# Patient Record
Sex: Male | Born: 1950 | Race: White | Hispanic: No | Marital: Married | Smoking: Former smoker
Health system: Southern US, Community
[De-identification: ages and names within clinical notes are randomized; demographics above are authoritative.]

## PROBLEM LIST (undated history)

## (undated) DIAGNOSIS — M549 Dorsalgia, unspecified: Secondary | ICD-10-CM

## (undated) DIAGNOSIS — G4730 Sleep apnea, unspecified: Secondary | ICD-10-CM

## (undated) DIAGNOSIS — C4490 Unspecified malignant neoplasm of skin, unspecified: Secondary | ICD-10-CM

## (undated) DIAGNOSIS — G8929 Other chronic pain: Secondary | ICD-10-CM

## (undated) DIAGNOSIS — E785 Hyperlipidemia, unspecified: Secondary | ICD-10-CM

## (undated) DIAGNOSIS — F419 Anxiety disorder, unspecified: Secondary | ICD-10-CM

## (undated) DIAGNOSIS — K219 Gastro-esophageal reflux disease without esophagitis: Secondary | ICD-10-CM

## (undated) DIAGNOSIS — G4733 Obstructive sleep apnea (adult) (pediatric): Secondary | ICD-10-CM

## (undated) HISTORY — DX: Other chronic pain: G89.29

## (undated) HISTORY — DX: Sleep apnea, unspecified: G47.30

## (undated) HISTORY — DX: Unspecified malignant neoplasm of skin, unspecified: C44.90

## (undated) HISTORY — DX: Gastro-esophageal reflux disease without esophagitis: K21.9

## (undated) HISTORY — DX: Anxiety disorder, unspecified: F41.9

## (undated) HISTORY — DX: Hyperlipidemia, unspecified: E78.5

## (undated) HISTORY — DX: Obstructive sleep apnea (adult) (pediatric): G47.33

## (undated) HISTORY — DX: Dorsalgia, unspecified: M54.9

## (undated) HISTORY — PX: COLONOSCOPY: SHX174

---

## 2003-12-09 ENCOUNTER — Ambulatory Visit: Payer: Self-pay | Admitting: Internal Medicine

## 2004-01-30 LAB — HM COLONOSCOPY: HM Colonoscopy: NORMAL

## 2006-01-29 HISTORY — PX: KNEE SURGERY: SHX244

## 2010-09-27 ENCOUNTER — Telehealth: Payer: Self-pay | Admitting: Internal Medicine

## 2010-09-27 ENCOUNTER — Other Ambulatory Visit: Payer: Self-pay | Admitting: Internal Medicine

## 2010-09-27 NOTE — Telephone Encounter (Signed)
I tried to call this patient tonight to get his dose on Methadone as we have not received his records.  I could not get him, as number was disconnected.  If he comes to clinic, we can provide a refill on Methadone, as he has been stable for several years on medication, however we must have him get records from [facility] and set up appt here.

## 2010-09-27 NOTE — Telephone Encounter (Signed)
Need rx refill methadone    Please call when ready pt would like to pick up 8/30

## 2010-09-28 ENCOUNTER — Other Ambulatory Visit: Payer: Self-pay | Admitting: Internal Medicine

## 2010-09-28 DIAGNOSIS — G8929 Other chronic pain: Secondary | ICD-10-CM

## 2010-09-28 MED ORDER — METHADONE HCL 10 MG PO TABS: 10.0000 mg | ORAL_TABLET | Freq: Three times a day (TID) | ORAL | Status: DC

## 2010-10-04 ENCOUNTER — Telehealth: Payer: Self-pay | Admitting: Internal Medicine

## 2010-10-04 NOTE — Telephone Encounter (Signed)
Work # [phone] Pt picked up rx last week for methadone the rx was for only 1 week pt will need another rx.  He would like to pick this up tomorrow if possible

## 2010-10-04 NOTE — Telephone Encounter (Signed)
That is fine. We can fill for one month. He should schedule appointment.

## 2010-10-05 ENCOUNTER — Telehealth: Payer: Self-pay | Admitting: Internal Medicine

## 2010-10-05 DIAGNOSIS — G8929 Other chronic pain: Secondary | ICD-10-CM

## 2010-10-05 MED ORDER — METHADONE HCL 10 MG PO TABS: 10.0000 mg | ORAL_TABLET | Freq: Three times a day (TID) | ORAL | Status: DC

## 2010-10-05 NOTE — Telephone Encounter (Signed)
Printed the Methadone Rx for you to sign.

## 2010-11-02 ENCOUNTER — Encounter: Payer: Self-pay | Admitting: Internal Medicine

## 2010-11-02 ENCOUNTER — Ambulatory Visit (INDEPENDENT_AMBULATORY_CARE_PROVIDER_SITE_OTHER): Payer: BC Managed Care – PPO | Admitting: Internal Medicine

## 2010-11-02 DIAGNOSIS — K219 Gastro-esophageal reflux disease without esophagitis: Secondary | ICD-10-CM | POA: Insufficient documentation

## 2010-11-02 DIAGNOSIS — F32A Depression, unspecified: Secondary | ICD-10-CM | POA: Insufficient documentation

## 2010-11-02 DIAGNOSIS — F329 Major depressive disorder, single episode, unspecified: Secondary | ICD-10-CM

## 2010-11-02 DIAGNOSIS — G8929 Other chronic pain: Secondary | ICD-10-CM | POA: Insufficient documentation

## 2010-11-02 MED ORDER — ALPRAZOLAM 0.5 MG PO TABS: 0.5000 mg | ORAL_TABLET | Freq: Three times a day (TID) | ORAL | Status: DC | PRN

## 2010-11-02 MED ORDER — METHADONE HCL 10 MG PO TABS: 10.0000 mg | ORAL_TABLET | Freq: Three times a day (TID) | ORAL | Status: DC

## 2010-11-02 MED ORDER — LANSOPRAZOLE 30 MG PO CPDR: 30.0000 mg | DELAYED_RELEASE_CAPSULE | Freq: Two times a day (BID) | ORAL | Status: DC

## 2010-11-02 MED ORDER — PAROXETINE HCL 40 MG PO TABS: 40.0000 mg | ORAL_TABLET | ORAL | Status: DC

## 2010-11-02 MED ORDER — VENLAFAXINE HCL ER 150 MG PO CP24: 150.0000 mg | ORAL_CAPSULE | Freq: Every day | ORAL | Status: DC

## 2010-11-02 MED ORDER — GABAPENTIN 300 MG PO CAPS: 600.0000 mg | ORAL_CAPSULE | Freq: Three times a day (TID) | ORAL | Status: DC

## 2010-11-02 NOTE — Progress Notes (Signed)
Subjective:    Patient ID: Bryan Hale, male    DOB: [birth year], 60 y.o.   MRN: [id number]  HPI Mr. Goffe is a 60 year old male with a history of chronic pain secondary to an injury he sustained from a fall. The pain is mostly located in his lower back. He has been followed at the pain clinic in the past. Over the last several years, he has been stable on current dose of methadone. He has not requested medication refills early. He has been fully compliant with medication instructions.  He reports a recent increase in his anxiety and depression. He notes that his job is currently unstable and his salary has decreased. He is debating changing jobs. He also has significant stress at home related to an issue with his neighbor. He does not wish to make changes in his medications today.  Outpatient Encounter Prescriptions as of 11/02/2010  Medication Sig Dispense Refill  . ALPRAZolam (XANAX) 0.5 MG tablet Take 1 tablet (0.5 mg total) by mouth 3 (three) times daily as needed.  90 tablet  3  . gabapentin (NEURONTIN) 300 MG capsule Take 2 capsules (600 mg total) by mouth 3 (three) times daily. Take one tablet 8 times a day  180 capsule  3  . lansoprazole (PREVACID) 30 MG capsule Take 1 capsule (30 mg total) by mouth 2 (two) times daily.  60 capsule  3  . methadone (DOLOPHINE) 10 MG tablet Take 1 tablet (10 mg total) by mouth 3 (three) times daily.  90 tablet  0  . PARoxetine (PAXIL) 40 MG tablet Take 1 tablet (40 mg total) by mouth every morning.  30 tablet  11  . venlafaxine (EFFEXOR-XR) 150 MG 24 hr capsule Take 1 capsule (150 mg total) by mouth daily.  30 capsule  11    Review of Systems  Constitutional: Negative for fever, chills, activity change, appetite change, fatigue and unexpected weight change.  Eyes: Negative for visual disturbance.  Respiratory: Negative for cough and shortness of breath.   Cardiovascular: Negative for chest pain, palpitations and leg swelling.  Gastrointestinal:  Negative for abdominal pain and abdominal distention.  Genitourinary: Negative for dysuria, urgency and difficulty urinating.  Musculoskeletal: Positive for myalgias, back pain and arthralgias. Negative for gait problem.  Skin: Negative for color change and rash.  Hematological: Negative for adenopathy.  Psychiatric/Behavioral: Positive for dysphoric mood. Negative for sleep disturbance. The patient is nervous/anxious.    BP 166/91  Pulse 68  Temp(Src) 98.8 F (37.1 C) (Oral)  Resp 16  Ht 5' 9.5" (1.765 m)  Wt 193 lb 4 oz (87.658 kg)  BMI 28.13 kg/m2  SpO2 94%     Objective:   Physical Exam  Constitutional: He is oriented to person, place, and time. He appears well-developed and well-nourished. No distress.  HENT:  Head: Normocephalic and atraumatic.  Right Ear: External ear normal.  Left Ear: External ear normal.  Nose: Nose normal.  Mouth/Throat: Oropharynx is clear and moist. No oropharyngeal exudate.  Eyes: Conjunctivae and EOM are normal. Pupils are equal, round, and reactive to light. Right eye exhibits no discharge. Left eye exhibits no discharge. No scleral icterus.  Neck: Normal range of motion. Neck supple. No tracheal deviation present. No thyromegaly present.  Cardiovascular: Normal rate, regular rhythm and normal heart sounds.  Exam reveals no gallop and no friction rub.   No murmur heard. Pulmonary/Chest: Effort normal and breath sounds normal. No respiratory distress. He has no wheezes. He has no rales. He exhibits  no tenderness.  Musculoskeletal: Normal range of motion. He exhibits no edema.  Lymphadenopathy:    He has no cervical adenopathy.  Neurological: He is alert and oriented to person, place, and time. No cranial nerve deficit. Coordination normal.  Skin: Skin is warm and dry. No rash noted. He is not diaphoretic. No erythema. No pallor.  Psychiatric: His behavior is normal. Judgment and thought content normal. His mood appears anxious. Cognition and memory  are normal. He exhibits a depressed mood.          Assessment & Plan:  1. Chronic back pain - After injury sustained from fall. Followed at pain clinic in past, and continues to see them q6-12 months.  Will get records from previous office for review. Has been stable on current dose of methodone for years. Will plan to continue. Will need to be seen at minimum q41months.   2. Anxiety/Depression - Recently exacerbated with decrease in salary and work stress.  We discussed change in meds, but he would prefer to leave for now.  RTC in 3 months.

## 2010-12-07 ENCOUNTER — Encounter: Payer: Self-pay | Admitting: Internal Medicine

## 2010-12-27 ENCOUNTER — Other Ambulatory Visit: Payer: Self-pay | Admitting: *Deleted

## 2010-12-27 DIAGNOSIS — K219 Gastro-esophageal reflux disease without esophagitis: Secondary | ICD-10-CM

## 2010-12-27 MED ORDER — LANSOPRAZOLE 30 MG PO CPDR: 30.0000 mg | DELAYED_RELEASE_CAPSULE | Freq: Two times a day (BID) | ORAL | Status: DC

## 2010-12-28 ENCOUNTER — Other Ambulatory Visit: Payer: Self-pay | Admitting: Internal Medicine

## 2010-12-28 DIAGNOSIS — G8929 Other chronic pain: Secondary | ICD-10-CM

## 2010-12-28 MED ORDER — METHADONE HCL 10 MG PO TABS: 10.0000 mg | ORAL_TABLET | Freq: Three times a day (TID) | ORAL | Status: DC

## 2010-12-28 NOTE — Telephone Encounter (Signed)
Patient notified that rx is ready. Rx left up front for pick up.

## 2011-01-24 ENCOUNTER — Telehealth: Payer: Self-pay | Admitting: Internal Medicine

## 2011-01-24 DIAGNOSIS — G8929 Other chronic pain: Secondary | ICD-10-CM

## 2011-01-24 NOTE — Telephone Encounter (Signed)
ok 

## 2011-01-24 NOTE — Telephone Encounter (Signed)
OK for RF of Methadone?

## 2011-01-25 MED ORDER — METHADONE HCL 10 MG PO TABS: 10.0000 mg | ORAL_TABLET | Freq: Three times a day (TID) | ORAL | Status: DC

## 2011-01-25 NOTE — Telephone Encounter (Signed)
Pending signature

## 2011-01-25 NOTE — Telephone Encounter (Signed)
Patient informed. 

## 2011-02-02 ENCOUNTER — Ambulatory Visit: Payer: BC Managed Care – PPO | Admitting: Internal Medicine

## 2011-02-03 ENCOUNTER — Encounter: Payer: Self-pay | Admitting: Internal Medicine

## 2011-02-07 ENCOUNTER — Ambulatory Visit: Payer: BC Managed Care – PPO | Admitting: Internal Medicine

## 2011-02-26 ENCOUNTER — Telehealth: Payer: Self-pay | Admitting: Internal Medicine

## 2011-02-26 DIAGNOSIS — G8929 Other chronic pain: Secondary | ICD-10-CM

## 2011-02-26 NOTE — Telephone Encounter (Signed)
Fine to fill. May print 3 months worth. Thanks

## 2011-02-26 NOTE — Telephone Encounter (Signed)
[phone] Pt called to get methadone rx please call when ready

## 2011-02-27 MED ORDER — METHADONE HCL 10 MG PO TABS: 10.0000 mg | ORAL_TABLET | Freq: Three times a day (TID) | ORAL | Status: DC

## 2011-02-27 NOTE — Telephone Encounter (Signed)
Pending signature

## 2011-02-28 NOTE — Telephone Encounter (Signed)
Pt picked up rx's

## 2011-04-05 ENCOUNTER — Telehealth: Payer: Self-pay | Admitting: Internal Medicine

## 2011-04-05 NOTE — Telephone Encounter (Signed)
I wrote note and printed.

## 2011-04-05 NOTE — Telephone Encounter (Signed)
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

## 2011-04-06 NOTE — Telephone Encounter (Signed)
Pt picked up note today.

## 2011-04-12 ENCOUNTER — Other Ambulatory Visit: Payer: Self-pay | Admitting: *Deleted

## 2011-04-12 DIAGNOSIS — K219 Gastro-esophageal reflux disease without esophagitis: Secondary | ICD-10-CM

## 2011-04-12 MED ORDER — LANSOPRAZOLE 30 MG PO CPDR: 30.0000 mg | DELAYED_RELEASE_CAPSULE | Freq: Two times a day (BID) | ORAL | Status: DC

## 2011-05-07 ENCOUNTER — Ambulatory Visit (INDEPENDENT_AMBULATORY_CARE_PROVIDER_SITE_OTHER): Payer: BC Managed Care – PPO | Admitting: Internal Medicine

## 2011-05-07 ENCOUNTER — Encounter: Payer: Self-pay | Admitting: Internal Medicine

## 2011-05-07 VITALS — BP 171/97 | HR 74 | Temp 98.9°F | Ht 69.5 in | Wt 201.2 lb

## 2011-05-07 DIAGNOSIS — H6691 Otitis media, unspecified, right ear: Secondary | ICD-10-CM | POA: Insufficient documentation

## 2011-05-07 DIAGNOSIS — H6690 Otitis media, unspecified, unspecified ear: Secondary | ICD-10-CM

## 2011-05-07 MED ORDER — BENZONATATE 200 MG PO CAPS: 200.0000 mg | ORAL_CAPSULE | Freq: Three times a day (TID) | ORAL | Status: AC | PRN

## 2011-05-07 MED ORDER — DOXYCYCLINE HYCLATE 100 MG PO TABS: 100.0000 mg | ORAL_TABLET | Freq: Two times a day (BID) | ORAL | Status: AC

## 2011-05-07 NOTE — Progress Notes (Signed)
Subjective:    Patient ID: Bryan Hale, male    DOB: 01/12/1951, 61 y.o.   MRN: [id number]  HPI 61 year old male presents for an acute visit complaining of five-day history of sore throat, fever, chills, nasal congestion, cough productive of purulent sputum. He reports that sore throat is gradually improving. He has been using some over-the-counter cough and cold medications with minimal improvement. He reports significant fatigue. He denies any shortness of breath. He notes some right ear pain. He notes numerous sick contacts at work. He also notes that he was bit by a tick over the weekend. He has had some headache over the last 24 hours.  Outpatient Prescriptions Prior to Visit  Medication Sig Dispense Refill  . ALPRAZolam (XANAX) 0.5 MG tablet Take 1 tablet (0.5 mg total) by mouth 3 (three) times daily as needed.  90 tablet  3  . lansoprazole (PREVACID) 30 MG capsule Take 1 capsule (30 mg total) by mouth 2 (two) times daily.  180 capsule  1  . methadone (DOLOPHINE) 10 MG tablet Take 1 tablet (10 mg total) by mouth 3 (three) times daily. Fill on or after 04/27/11  90 tablet  0  . PARoxetine (PAXIL) 40 MG tablet Take 1 tablet (40 mg total) by mouth every morning.  30 tablet  11  . venlafaxine (EFFEXOR-XR) 150 MG 24 hr capsule Take 1 capsule (150 mg total) by mouth daily.  30 capsule  11  . gabapentin (NEURONTIN) 300 MG capsule Take 2 capsules (600 mg total) by mouth 3 (three) times daily. Take one tablet 8 times a day  180 capsule  3    Review of Systems  Constitutional: Positive for fever, chills and fatigue. Negative for activity change.  HENT: Positive for congestion, sore throat, rhinorrhea and postnasal drip. Negative for hearing loss, ear pain, nosebleeds, sneezing, trouble swallowing, neck pain, neck stiffness, voice change, sinus pressure, tinnitus and ear discharge.   Eyes: Negative for discharge, redness, itching and visual disturbance.  Respiratory: Positive for cough. Negative  for chest tightness, shortness of breath, wheezing and stridor.   Cardiovascular: Negative for chest pain and leg swelling.  Gastrointestinal: Positive for nausea. Negative for abdominal pain, diarrhea and constipation.  Musculoskeletal: Negative for myalgias and arthralgias.  Skin: Negative for color change and rash.  Neurological: Positive for headaches. Negative for dizziness and facial asymmetry.  Psychiatric/Behavioral: Negative for sleep disturbance.   BP 171/97  Pulse 74  Temp(Src) 98.9 F (37.2 C) (Oral)  Ht 5' 9.5" (1.765 m)  Wt 201 lb 4 oz (91.286 kg)  BMI 29.29 kg/m2  SpO2 95%     Objective:   Physical Exam  Constitutional: He is oriented to person, place, and time. He appears well-developed and well-nourished. No distress.  HENT:  Head: Normocephalic and atraumatic.  Right Ear: External ear normal. Tympanic membrane is erythematous and bulging. A middle ear effusion is present.  Left Ear: External ear normal. Tympanic membrane is not retracted and not bulging.  No middle ear effusion.  Nose: Nose normal.  Mouth/Throat: Oropharynx is clear and moist. No oropharyngeal exudate.  Eyes: Conjunctivae and EOM are normal. Pupils are equal, round, and reactive to light. Right eye exhibits no discharge. Left eye exhibits no discharge. No scleral icterus.  Neck: Normal range of motion. Neck supple. No tracheal deviation present. No thyromegaly present.  Cardiovascular: Normal rate, regular rhythm and normal heart sounds.  Exam reveals no gallop and no friction rub.   No murmur heard. Pulmonary/Chest: Effort normal.  No accessory muscle usage. Not tachypneic. No respiratory distress. He has no wheezes. He has rhonchi in the left lower field. He has no rales. He exhibits no tenderness.  Musculoskeletal: Normal range of motion. He exhibits no edema.  Lymphadenopathy:    He has no cervical adenopathy.  Neurological: He is alert and oriented to person, place, and time. No cranial nerve  deficit. Coordination normal.  Skin: Skin is warm and dry. No rash noted. He is not diaphoretic. No erythema. No pallor.  Psychiatric: He has a normal mood and affect. His behavior is normal. Judgment and thought content normal.          Assessment & Plan:

## 2011-05-07 NOTE — Assessment & Plan Note (Signed)
Symptoms are most consistent with viral infection which has now progressed to secondary bacterial otitis media. Given that he has a recent history of a tick bite and headache will treat ear infection with doxycycline 100 mg twice daily for 14 days, to provide coverage for RMSF. He will call clinic or followup if symptoms are not improving over the next 48 hours. He will use Tessalon as needed for cough.

## 2011-05-15 ENCOUNTER — Telehealth: Payer: Self-pay | Admitting: *Deleted

## 2011-05-15 DIAGNOSIS — F329 Major depressive disorder, single episode, unspecified: Secondary | ICD-10-CM

## 2011-05-15 MED ORDER — ALPRAZOLAM 0.5 MG PO TABS: 0.5000 mg | ORAL_TABLET | Freq: Three times a day (TID) | ORAL | Status: DC | PRN

## 2011-05-15 NOTE — Telephone Encounter (Signed)
Called in.

## 2011-05-15 NOTE — Telephone Encounter (Signed)
Fine to fill. #90 with 3 refills

## 2011-05-15 NOTE — Telephone Encounter (Signed)
[facility] faxed RF request -  Xanax 0.5 mg 1 tid prn. OK? If yes, how many RFs?

## 2011-05-18 ENCOUNTER — Telehealth: Payer: Self-pay | Admitting: Internal Medicine

## 2011-05-18 MED ORDER — LEVOFLOXACIN 750 MG PO TABS: 750.0000 mg | ORAL_TABLET | Freq: Every day | ORAL | Status: AC

## 2011-05-18 NOTE — Telephone Encounter (Signed)
[facility] Triage Call Report Triage Record Num: [id number] Operator: Lyn Hollingshead Patient Name: Bryan Hale Call Date & Time: 05/18/2011 3:13:55PM Patient Phone: [phone] PCP: Ronna Polio Patient Gender: Male PCP Fax : [phone] Patient DOB: 04/13/1950 Practice Name: [facility] Reason for Call: Caller: [name]/Patient; PCP: Ronna Polio; CB#: [phone]; ; ; Call regarding Cough/Congestion; PLEASE CALL PT- Onset-05/09/11 Afebrile. Pt has congestion and productive cough with yellow sputum. States he was seen by Dr. Dan Humphreys and told to call back if s/s no better and he could get another antibiotic called in. Emergent s/s of URI protocol r/o. Pt to see provider within 24 hrs. Message sent because pt states he was told to call back and rx could be sent if s/s not improved. Pharmacy is [facility]. Protocol(s) Used: Upper Respiratory Infection (URI) Recommended Outcome per Protocol: See Provider within 24 hours Reason for Outcome: Productive cough with colored sputum (other than clear or white sputum) Care Advice: ~ Use a cool mist humidifier to moisten air. Be sure to clean according to manufacturer's instructions. ~ May inhale steam from hot shower or heated water. Be careful to avoid burns. ~ Warm fluids may help, or try a mixture of honey and lemon juice in warm tea. ~ SYMPTOM / CONDITION MANAGEMENT Coughing up mucus or phlegm helps to get rid of an infection. A productive cough should not be stopped. A cough medicine with guaifenesin (Robitussin, Mucinex) can help loosen the mucus. Cough medicine with dextromethorphan (DM) should be avoided. Drinking lots of fluids can help loosen the mucus too, especially warm fluids. ~ 05/18/2011 3:35:05PM Page 1 of 1 [email or web address]

## 2011-05-18 NOTE — Telephone Encounter (Signed)
Please call in Levaquin 750mg  po daily x 7 days. He should be seen next week if no improvement.

## 2011-05-18 NOTE — Telephone Encounter (Signed)
Patient notified. Rx called to pharmacy,.

## 2011-05-24 ENCOUNTER — Other Ambulatory Visit: Payer: Self-pay | Admitting: *Deleted

## 2011-05-24 DIAGNOSIS — G8929 Other chronic pain: Secondary | ICD-10-CM

## 2011-05-24 MED ORDER — METHADONE HCL 10 MG PO TABS: 10.0000 mg | ORAL_TABLET | Freq: Three times a day (TID) | ORAL | Status: DC

## 2011-05-24 NOTE — Telephone Encounter (Signed)
Patient request refill on Methadone Last filled 02/27/11, #90 x 0 Last seen for chronic pain on 11/02/10, acute visit on 05/07/11 Follow up needed in 05/2011 Please advise refills.

## 2011-05-24 NOTE — Telephone Encounter (Signed)
Pt notified RX ready.

## 2011-07-03 ENCOUNTER — Other Ambulatory Visit: Payer: Self-pay | Admitting: *Deleted

## 2011-07-03 DIAGNOSIS — G8929 Other chronic pain: Secondary | ICD-10-CM

## 2011-07-03 MED ORDER — METHADONE HCL 10 MG PO TABS: 10.0000 mg | ORAL_TABLET | Freq: Three times a day (TID) | ORAL | Status: DC

## 2011-07-03 NOTE — Telephone Encounter (Signed)
Left message on cell phone voicemail advising patient that Rx is ready for pick up will be left at front desk. 

## 2011-08-03 ENCOUNTER — Other Ambulatory Visit: Payer: Self-pay | Admitting: Internal Medicine

## 2011-08-03 ENCOUNTER — Other Ambulatory Visit: Payer: Self-pay | Admitting: *Deleted

## 2011-08-03 DIAGNOSIS — G8929 Other chronic pain: Secondary | ICD-10-CM

## 2011-08-03 MED ORDER — METHADONE HCL 10 MG PO TABS: 10.0000 mg | ORAL_TABLET | Freq: Three times a day (TID) | ORAL | Status: DC

## 2011-08-03 NOTE — Telephone Encounter (Signed)
Rx rewritten so that Dr. Darrick Huntsman can sign it in Dr. Tilman Neat absence.  Rx signed by Dr. Darrick Huntsman and given to patient.

## 2011-08-03 NOTE — Telephone Encounter (Signed)
Refill on his Methadone 10 mg. Please call when it is finished.

## 2011-08-06 NOTE — Telephone Encounter (Signed)
Rx was rewritten so Dr. Darrick Huntsman can sign it since Dr. Dan Humphreys was out of the office, patient picked up Rx on 08/03/2011.

## 2011-08-31 DIAGNOSIS — F329 Major depressive disorder, single episode, unspecified: Secondary | ICD-10-CM | POA: Insufficient documentation

## 2011-10-02 ENCOUNTER — Other Ambulatory Visit: Payer: Self-pay | Admitting: *Deleted

## 2011-10-02 DIAGNOSIS — G8929 Other chronic pain: Secondary | ICD-10-CM

## 2011-10-02 MED ORDER — METHADONE HCL 10 MG PO TABS: 10.0000 mg | ORAL_TABLET | Freq: Three times a day (TID) | ORAL | Status: DC

## 2011-10-03 NOTE — Telephone Encounter (Signed)
Left message on cell phone voicemail advising patient that Rx is ready for pick up will be left at front desk. 

## 2011-11-01 ENCOUNTER — Other Ambulatory Visit: Payer: Self-pay | Admitting: Internal Medicine

## 2011-11-01 DIAGNOSIS — G8929 Other chronic pain: Secondary | ICD-10-CM

## 2011-11-01 MED ORDER — METHADONE HCL 10 MG PO TABS: 10.0000 mg | ORAL_TABLET | Freq: Three times a day (TID) | ORAL | Status: DC

## 2011-11-01 NOTE — Telephone Encounter (Signed)
Pt is needing Methodone refilled. Please call patient when you can.

## 2011-11-01 NOTE — Telephone Encounter (Signed)
Rx ready for pick up will be left at front desk, called patient on cell number and recording stated that code or number dialed is incorrect.  Called patient on number given and recording stated that number was not available for incoming calls.

## 2011-11-02 ENCOUNTER — Telehealth: Payer: Self-pay | Admitting: Internal Medicine

## 2011-11-02 NOTE — Telephone Encounter (Signed)
Disreguard the last message I seen where the rx is ready for pt to pick it up and I let him know.

## 2011-11-02 NOTE — Telephone Encounter (Signed)
Pt is needing his Metodone refilled he will be completely out by this weekend.

## 2011-11-05 ENCOUNTER — Other Ambulatory Visit: Payer: Self-pay | Admitting: Internal Medicine

## 2011-11-26 ENCOUNTER — Other Ambulatory Visit: Payer: Self-pay | Admitting: Internal Medicine

## 2011-11-27 ENCOUNTER — Other Ambulatory Visit: Payer: Self-pay | Admitting: Internal Medicine

## 2011-11-27 NOTE — Telephone Encounter (Signed)
Rx called to [facility], patient notified.

## 2011-11-27 NOTE — Telephone Encounter (Signed)
Pt calling to check on his xanax refill  [facility]  [phone] has already contacted [place] and needs prior autho

## 2011-12-05 ENCOUNTER — Ambulatory Visit (INDEPENDENT_AMBULATORY_CARE_PROVIDER_SITE_OTHER): Payer: BC Managed Care – PPO | Admitting: Internal Medicine

## 2011-12-05 ENCOUNTER — Encounter: Payer: Self-pay | Admitting: Internal Medicine

## 2011-12-05 VITALS — BP 120/80 | HR 67 | Temp 98.7°F | Ht 69.5 in

## 2011-12-05 DIAGNOSIS — F411 Generalized anxiety disorder: Secondary | ICD-10-CM

## 2011-12-05 DIAGNOSIS — G8929 Other chronic pain: Secondary | ICD-10-CM

## 2011-12-05 DIAGNOSIS — F419 Anxiety disorder, unspecified: Secondary | ICD-10-CM | POA: Insufficient documentation

## 2011-12-05 MED ORDER — METHADONE HCL 10 MG PO TABS: 10.0000 mg | ORAL_TABLET | Freq: Three times a day (TID) | ORAL | Status: DC

## 2011-12-05 MED ORDER — CLONAZEPAM 0.5 MG PO TABS: 0.5000 mg | ORAL_TABLET | Freq: Two times a day (BID) | ORAL | Status: DC | PRN

## 2011-12-05 MED ORDER — CLONAZEPAM 0.5 MG PO TABS: 0.5000 mg | ORAL_TABLET | Freq: Three times a day (TID) | ORAL | Status: DC | PRN

## 2011-12-05 NOTE — Progress Notes (Signed)
Subjective:    Patient ID: Bryan Hale, male    DOB: [birth year], 61 y.o.   MRN: [id number]  HPI 61 year old male with history of chronic pain secondary to traumatic injury, anxiety, and depression presents for acute visit complaining of recent worsening of anxiety with episodes of panic. He was previously taking Paxil and Effexor but felt that these medications are not effective. He tapered off Effexor. He noted no change in symptoms with stopping Effexor. He now continues on Paxil 40 mg daily and alprazolam as needed, typically once or twice a week for episodes of panic attack. He reports that overall stress at home and in his job have improved. However, he is having more frequent episodes of panic with overwhelming sense of dread and fever. He is unable to function during these episodes. He has some improvement with use of alprazolam but not complete relief of symptoms.  In regards to chronic pain, he reports symptoms have been well controlled on methadone. He has been stable on his dose of methadone for several years. He is also followed at the [facility] pain management clinic every 6 months. He has not requested any early refills. He has been compliant with pain contract.  Outpatient Encounter Prescriptions as of 12/05/2011  Medication Sig Dispense Refill  . ALPRAZolam (XANAX) 0.5 MG tablet TAKE ONE TABLET THREE TIMES A DAY       IF NEEDED  90 tablet  3  . gabapentin (NEURONTIN) 300 MG capsule Take 600 mg by mouth 3 (three) times daily.      . lansoprazole (PREVACID) 30 MG capsule Take 1 capsule (30 mg total) by mouth 2 (two) times daily.  180 capsule  1  . methadone (DOLOPHINE) 10 MG tablet Take 1 tablet (10 mg total) by mouth 3 (three) times daily. Fill on or after 02/04/2012  90 tablet  0  . clonazePAM (KLONOPIN) 0.5 MG tablet Take 1 tablet (0.5 mg total) by mouth 3 (three) times daily as needed for anxiety.  90 tablet  1  . PARoxetine (PAXIL) 40 MG tablet TAKE ONE (1) TABLET EACH DAY   30 tablet  6   BP 120/80  Pulse 67  Temp 98.7 F (37.1 C) (Oral)  Ht 5' 9.5" (1.765 m)  SpO2 97%  Review of Systems  Constitutional: Negative for fever, chills, activity change, appetite change, fatigue and unexpected weight change.  Eyes: Negative for visual disturbance.  Respiratory: Negative for cough and shortness of breath.   Cardiovascular: Negative for chest pain, palpitations and leg swelling.  Gastrointestinal: Negative for abdominal pain and abdominal distention.  Genitourinary: Negative for dysuria, urgency and difficulty urinating.  Musculoskeletal: Positive for myalgias, back pain and arthralgias. Negative for gait problem.  Skin: Negative for color change and rash.  Hematological: Negative for adenopathy.  Psychiatric/Behavioral: Positive for dysphoric mood. Negative for sleep disturbance. The patient is nervous/anxious.        Objective:   Physical Exam  Constitutional: He is oriented to person, place, and time. He appears well-developed and well-nourished. No distress.  HENT:  Head: Normocephalic.  Eyes: Pupils are equal, round, and reactive to light.  Neck: Normal range of motion.  Cardiovascular: Normal rate.   Pulmonary/Chest: Effort normal.  Neurological: He is alert and oriented to person, place, and time. He has normal reflexes.  Skin: He is not diaphoretic.  Psychiatric: His speech is normal and behavior is normal. Judgment and thought content normal. His mood appears anxious. Cognition and memory are normal.  Assessment & Plan:

## 2011-12-05 NOTE — Assessment & Plan Note (Signed)
Patient reports recent increase in anxiety. No improvement on Paxil. Will try adding Clonopin to help with symptoms of panic. We discussed side effects of this medication including sedation. We also discussed potentially adding a beta blocker to help with symptoms of panic or fear during social interaction. Patient will e-mail with update next week. He will followup in one month.

## 2011-12-05 NOTE — Assessment & Plan Note (Signed)
Patient with chronic pain in his back and ankle secondary to previous injury. He is followed at the pain clinic and has been on chronic methadone. He will continue to follow both here and with regular followup at the pain clinic. Refill given on Methadone today. Followup here 1 month.

## 2012-02-27 ENCOUNTER — Other Ambulatory Visit: Payer: Self-pay | Admitting: Internal Medicine

## 2012-04-07 ENCOUNTER — Other Ambulatory Visit: Payer: Self-pay | Admitting: Internal Medicine

## 2012-04-07 DIAGNOSIS — G8929 Other chronic pain: Secondary | ICD-10-CM

## 2012-04-07 MED ORDER — METHADONE HCL 10 MG PO TABS: 10.0000 mg | ORAL_TABLET | Freq: Three times a day (TID) | ORAL | Status: DC

## 2012-04-07 NOTE — Telephone Encounter (Signed)
PT CALLED TO GET HIS METADONE REFILLED PT WOULD LIKE TO PICK UP 3/11 AFTER 3:30

## 2012-04-07 NOTE — Telephone Encounter (Signed)
Tried to call patient to let him know Rx was ready for pick up. Neither number was in service

## 2012-04-07 NOTE — Telephone Encounter (Signed)
Patient requesting refill on Methadone to be picked up today.

## 2012-04-07 NOTE — Telephone Encounter (Signed)
Rx printed signed and left up front for pick up

## 2012-05-07 ENCOUNTER — Other Ambulatory Visit: Payer: Self-pay | Admitting: General Practice

## 2012-05-07 DIAGNOSIS — G8929 Other chronic pain: Secondary | ICD-10-CM

## 2012-05-07 MED ORDER — METHADONE HCL 10 MG PO TABS: 10.0000 mg | ORAL_TABLET | Freq: Three times a day (TID) | ORAL | Status: DC

## 2012-05-07 NOTE — Telephone Encounter (Signed)
Dr. Dan Humphreys pt requesting a methadone refill. Pt last seen on 12/05/11. Med last filled on 3/10 # 90 with 0 refills. Pt takes medication 3 times daily. Please advise.

## 2012-05-07 NOTE — Telephone Encounter (Signed)
Will fill for one month.

## 2012-05-07 NOTE — Telephone Encounter (Signed)
Pt called back.  Advised pt would be filled x 1 month.  Pt stated he would be able to pick up tomorrow or Friday.  Please advise if this will not be ready by then.

## 2012-05-07 NOTE — Telephone Encounter (Signed)
Cannot reach pt. Rx placed up front for pt to pick up.

## 2012-05-08 NOTE — Telephone Encounter (Signed)
Pt called back and was notified rx was available at the front desk.

## 2012-05-24 ENCOUNTER — Other Ambulatory Visit: Payer: Self-pay | Admitting: Internal Medicine

## 2012-05-26 NOTE — Telephone Encounter (Signed)
Rx sent to pharmacy by escript  

## 2012-06-04 ENCOUNTER — Telehealth: Payer: Self-pay | Admitting: Emergency Medicine

## 2012-06-04 NOTE — Telephone Encounter (Signed)
Pt needs to be seen every 3 months on chronic pain medications. Needs a follow up visit.

## 2012-06-04 NOTE — Telephone Encounter (Signed)
Left message with pt's wife to return my call.  

## 2012-06-04 NOTE — Telephone Encounter (Signed)
Pt called stating he needs a refill on his methadone. Please advise

## 2012-06-05 ENCOUNTER — Ambulatory Visit (INDEPENDENT_AMBULATORY_CARE_PROVIDER_SITE_OTHER): Payer: BC Managed Care – PPO | Admitting: Internal Medicine

## 2012-06-05 ENCOUNTER — Encounter: Payer: Self-pay | Admitting: Internal Medicine

## 2012-06-05 VITALS — BP 168/80 | HR 72 | Temp 98.4°F | Wt 201.0 lb

## 2012-06-05 DIAGNOSIS — F411 Generalized anxiety disorder: Secondary | ICD-10-CM

## 2012-06-05 DIAGNOSIS — F419 Anxiety disorder, unspecified: Secondary | ICD-10-CM

## 2012-06-05 DIAGNOSIS — G4733 Obstructive sleep apnea (adult) (pediatric): Secondary | ICD-10-CM

## 2012-06-05 DIAGNOSIS — G8929 Other chronic pain: Secondary | ICD-10-CM

## 2012-06-05 MED ORDER — METHADONE HCL 10 MG PO TABS: 10.0000 mg | ORAL_TABLET | Freq: Three times a day (TID) | ORAL | Status: DC

## 2012-06-05 MED ORDER — PAROXETINE HCL 40 MG PO TABS: 40.0000 mg | ORAL_TABLET | ORAL | Status: DC

## 2012-06-05 MED ORDER — GABAPENTIN 300 MG PO CAPS: 600.0000 mg | ORAL_CAPSULE | Freq: Three times a day (TID) | ORAL | Status: DC

## 2012-06-05 MED ORDER — CLONAZEPAM 0.5 MG PO TABS: 0.5000 mg | ORAL_TABLET | Freq: Three times a day (TID) | ORAL | Status: DC | PRN

## 2012-06-05 MED ORDER — ALPRAZOLAM 0.5 MG PO TABS: ORAL_TABLET | ORAL | Status: DC

## 2012-06-05 NOTE — Assessment & Plan Note (Signed)
Symptoms well controlled with Paxil and Klonopin. Pt also occasionally uses alprazolam for severe episodes of panic. Will continue current medications. Continue follow up with psychologist. Follow up here in 6 months and prn.

## 2012-06-05 NOTE — Assessment & Plan Note (Signed)
Recently diagnosed with obstructive sleep apnea. Will request records on sleep study. Recommended to proceed with BiPAP as directed.

## 2012-06-05 NOTE — Progress Notes (Signed)
Subjective:    Patient ID: Bryan Hale, male    DOB: May 02, 1950, 62 y.o.   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 He reports in the interim since his last visit, he was seen by pain management physician at [facility]. No changes were made in his medications. He was having increased frequency of anxiety and panic attacks and was seen by psychologist. He had briefly stopped taking Paxil but was placed back on this medication with some improvement. He also continues to use Clonopin and alprazolam as needed. His psychologist was concerned about sleep apnea playing a role in his symptoms and he underwent sleep study which she reports showed severe obstructive sleep apnea. He underwent titration study showing need for BiPAP. He is scheduled to start on BiPAP therapy today.   Outpatient Encounter Prescriptions as of 06/05/2012  Medication Sig Dispense Refill  . ALPRAZolam (XANAX) 0.5 MG tablet TAKE ONE TABLET THREE TIMES A DAY       IF NEEDED  90 tablet  3  . clonazePAM (KLONOPIN) 0.5 MG tablet Take 1 tablet (0.5 mg total) by mouth 3 (three) times daily as needed for anxiety.  90 tablet  1  . gabapentin (NEURONTIN) 300 MG capsule Take 2 capsules (600 mg total) by mouth 3 (three) times daily.  180 capsule  11  . lansoprazole (PREVACID) 30 MG capsule TAKE ONE CAPSULE TWICE A DAY  180 capsule  1  . methadone (DOLOPHINE) 10 MG tablet Take 1 tablet (10 mg total) by mouth 3 (three) times daily. Fill 08/05/2012  90 tablet  0  . PARoxetine (PAXIL) 40 MG tablet Take 1 tablet (40 mg total) by mouth every morning.  90 tablet  4   No facility-administered encounter medications on file as of 06/05/2012.   BP 168/80  Pulse 72  Temp(Src) 98.4 F (36.9 C) (Oral)  Wt 201 lb (91.173 kg)  BMI 29.27 kg/m2  SpO2 97%  Review of Systems  Constitutional: Negative for fever, chills, activity change, appetite change,  fatigue and unexpected weight change.  Eyes: Negative for visual disturbance.  Respiratory: Negative for cough and shortness of breath.   Cardiovascular: Negative for chest pain, palpitations and leg swelling.  Gastrointestinal: Negative for abdominal pain and abdominal distention.  Genitourinary: Negative for dysuria, urgency and difficulty urinating.  Musculoskeletal: Positive for myalgias, back pain and arthralgias. Negative for gait problem.  Skin: Negative for color change and rash.  Hematological: Negative for adenopathy.  Psychiatric/Behavioral: Negative for suicidal ideas, sleep disturbance and dysphoric mood. The patient is nervous/anxious.        Objective:   Physical Exam  Constitutional: He is oriented to person, place, and time. He appears well-developed and well-nourished. No distress.  HENT:  Head: Normocephalic and atraumatic.  Right Ear: External ear normal.  Left Ear: External ear normal.  Nose: Nose normal.  Mouth/Throat: Oropharynx is clear and moist. No oropharyngeal exudate.  Eyes: Conjunctivae and EOM are normal. Pupils are equal, round, and reactive to light. Right eye exhibits no discharge. Left eye exhibits no discharge. No scleral icterus.  Neck: Normal range of motion. Neck supple. No tracheal deviation present. No thyromegaly present.  Cardiovascular: Normal rate, regular rhythm and normal heart sounds.  Exam reveals no gallop and no friction rub.   No murmur heard. Pulmonary/Chest: Effort normal and breath sounds normal. No respiratory distress. He has no wheezes. He has no rales. He exhibits no  tenderness.  Musculoskeletal: Normal range of motion. He exhibits no edema.  Lymphadenopathy:    He has no cervical adenopathy.  Neurological: He is alert and oriented to person, place, and time. No cranial nerve deficit. Coordination normal.  Skin: Skin is warm and dry. No rash noted. He is not diaphoretic. No erythema. No pallor.  Psychiatric: He has a normal  mood and affect. His behavior is normal. Judgment and thought content normal.          Assessment & Plan:

## 2012-06-05 NOTE — Telephone Encounter (Signed)
Patient seen in clinic today

## 2012-06-05 NOTE — Assessment & Plan Note (Signed)
Symptoms stable and well controlled with methadone. Reviewed recent notes from pain management physician at [facility] including urine drug screen which was appropriately positive for methadone alone. Patient will followup at [facility] in 3 months and followup here in 6 months.Rx given for Methadone, dated for next 3 months.

## 2012-06-13 ENCOUNTER — Ambulatory Visit: Payer: BC Managed Care – PPO | Admitting: Internal Medicine

## 2012-06-24 ENCOUNTER — Other Ambulatory Visit: Payer: Self-pay | Admitting: *Deleted

## 2012-06-24 MED ORDER — LANSOPRAZOLE 30 MG PO CPDR: DELAYED_RELEASE_CAPSULE | ORAL | Status: DC

## 2012-06-24 NOTE — Telephone Encounter (Signed)
Eprescribed.

## 2012-06-25 ENCOUNTER — Telehealth: Payer: Self-pay | Admitting: *Deleted

## 2012-06-25 NOTE — Telephone Encounter (Signed)
Called [phone] for prior authorization on the Lansoprazole DR  30 mg, PA form is being faxed over

## 2012-06-25 NOTE — Telephone Encounter (Signed)
Received PA Criteria question form, put them in Dr. Sonny Dandy folder

## 2012-07-03 ENCOUNTER — Encounter: Payer: Self-pay | Admitting: Internal Medicine

## 2012-07-09 ENCOUNTER — Encounter: Payer: Self-pay | Admitting: Internal Medicine

## 2012-07-09 ENCOUNTER — Ambulatory Visit (INDEPENDENT_AMBULATORY_CARE_PROVIDER_SITE_OTHER): Payer: BC Managed Care – PPO | Admitting: Internal Medicine

## 2012-07-09 VITALS — BP 150/100 | HR 78 | Temp 98.6°F | Wt 197.0 lb

## 2012-07-09 DIAGNOSIS — M109 Gout, unspecified: Secondary | ICD-10-CM | POA: Insufficient documentation

## 2012-07-09 MED ORDER — INDOMETHACIN 50 MG PO CAPS: 50.0000 mg | ORAL_CAPSULE | Freq: Three times a day (TID) | ORAL | Status: DC

## 2012-07-09 MED ORDER — COLCHICINE 0.6 MG PO TABS: ORAL_TABLET | ORAL | Status: DC

## 2012-07-09 NOTE — Patient Instructions (Signed)
Gout  Gout is an inflammatory condition (arthritis) caused by a buildup of uric acid crystals in the joints. Uric acid is a chemical that is normally present in the blood. Under some circumstances, uric acid can form into crystals in your joints. This causes joint redness, soreness, and swelling (inflammation). Repeat attacks are common. Over time, uric acid crystals can form into masses (tophi) near a joint, causing disfigurement. Gout is treatable and often preventable.  CAUSES   The disease begins with elevated levels of uric acid in the blood. Uric acid is produced by your body when it breaks down a naturally found substance called purines. This also happens when you eat certain foods such as meats and fish. Causes of an elevated uric acid level include:   Being passed down from parent to child (heredity).   Diseases that cause increased uric acid production (obesity, psoriasis, some cancers).   Excessive alcohol use.   Diet, especially diets rich in meat and seafood.   Medicines, including certain cancer-fighting drugs (chemotherapy), diuretics, and aspirin.   Chronic kidney disease. The kidneys are no longer able to remove uric acid well.   Problems with metabolism.  Conditions strongly associated with gout include:   Obesity.   High blood pressure.   High cholesterol.   Diabetes.  Not everyone with elevated uric acid levels gets gout. It is not understood why some people get gout and others do not. Surgery, joint injury, and eating too much of certain foods are some of the factors that can lead to gout.  SYMPTOMS    An attack of gout comes on quickly. It causes intense pain with redness, swelling, and warmth in a joint.   Fever can occur.   Often, only one joint is involved. Certain joints are more commonly involved:   Base of the big toe.   Knee.   Ankle.   Wrist.   Finger.  Without treatment, an attack usually goes away in a few days to weeks. Between attacks, you usually will not have  symptoms, which is different from many other forms of arthritis.  DIAGNOSIS   Your caregiver will suspect gout based on your symptoms and exam. Removal of fluid from the joint (arthrocentesis) is done to check for uric acid crystals. Your caregiver will give you a medicine that numbs the area (local anesthetic) and use a needle to remove joint fluid for exam. Gout is confirmed when uric acid crystals are seen in joint fluid, using a special microscope. Sometimes, blood, urine, and X-ray tests are also used.  TREATMENT   There are 2 phases to gout treatment: treating the sudden onset (acute) attack and preventing attacks (prophylaxis).  Treatment of an Acute Attack   Medicines are used. These include anti-inflammatory medicines or steroid medicines.   An injection of steroid medicine into the affected joint is sometimes necessary.   The painful joint is rested. Movement can worsen the arthritis.   You may use warm or cold treatments on painful joints, depending which works best for you.   Discuss the use of coffee, vitamin C, or cherries with your caregiver. These may be helpful treatment options.  Treatment to Prevent Attacks  After the acute attack subsides, your caregiver may advise prophylactic medicine. These medicines either help your kidneys eliminate uric acid from your body or decrease your uric acid production. You may need to stay on these medicines for a very long time.  The early phase of treatment with prophylactic medicine can be associated   with an increase in acute gout attacks. For this reason, during the first few months of treatment, your caregiver may also advise you to take medicines usually used for acute gout treatment. Be sure you understand your caregiver's directions.  You should also discuss dietary treatment with your caregiver. Certain foods such as meats and fish can increase uric acid levels. Other foods such as dairy can decrease levels. Your caregiver can give you a list of foods  to avoid.  HOME CARE INSTRUCTIONS    Do not take aspirin to relieve pain. This raises uric acid levels.   Only take over-the-counter or prescription medicines for pain, discomfort, or fever as directed by your caregiver.   Rest the joint as much as possible. When in bed, keep sheets and blankets off painful areas.   Keep the affected joint raised (elevated).   Use crutches if the painful joint is in your leg.   Drink enough water and fluids to keep your urine clear or pale yellow. This helps your body get rid of uric acid. Do not drink alcoholic beverages. They slow the passage of uric acid.   Follow your caregiver's dietary instructions. Pay careful attention to the amount of protein you eat. Your daily diet should emphasize fruits, vegetables, whole grains, and fat-free or low-fat milk products.   Maintain a healthy body weight.  SEEK MEDICAL CARE IF:    You have an oral temperature above 102 F (38.9 C).   You develop diarrhea, vomiting, or any side effects from medicines.   You do not feel better in 24 hours, or you are getting worse.  SEEK IMMEDIATE MEDICAL CARE IF:    Your joint becomes suddenly more tender and you have:   Chills.   An oral temperature above 102 F (38.9 C), not controlled by medicine.  MAKE SURE YOU:    Understand these instructions.   Will watch your condition.   Will get help right away if you are not doing well or get worse.  Document Released: 01/13/2000 Document Revised: 04/09/2011 Document Reviewed: 04/25/2009  Exit[employer] Patient Information 2014 [employer].

## 2012-07-09 NOTE — Assessment & Plan Note (Signed)
Symptoms and exam are consistent with gout flare. Will treat with indomethacin and colchicine. We discussed potentially starting preventative medication in the future given he has had several episodes of gout over the last few months. We also discussed preventative measures including avoidance of alcohol and some foods such as venison. Followup 4 weeks.

## 2012-07-09 NOTE — Progress Notes (Signed)
Subjective:    Patient ID: Bryan Hale, male    DOB: 03/11/1950, 62 y.o.   MRN: [id number]  HPI 62 year old male with history of chronic pain presents for acute visit complaining of left knee pain and swelling gradually worsening over the last week. He reports symptoms are consistent with previous episodes of gout. In the past, he has used indomethacin with some improvement. The last week, he has been taking some ibuprofen at home with no improvement. He describes severe pain in his left knee that prevents him from sleeping. He also notes some swelling in his left knee and decreased range of motion. He has had several episodes of gout this year. He has never taken preventative medications for gout. He denies any fever or chills.  Outpatient Encounter Prescriptions as of 07/09/2012  Medication Sig Dispense Refill  . ALPRAZolam (XANAX) 0.5 MG tablet TAKE ONE TABLET THREE TIMES A DAY       IF NEEDED  90 tablet  3  . clonazePAM (KLONOPIN) 0.5 MG tablet Take 1 tablet (0.5 mg total) by mouth 3 (three) times daily as needed for anxiety.  90 tablet  1  . gabapentin (NEURONTIN) 300 MG capsule Take 2 capsules (600 mg total) by mouth 3 (three) times daily.  180 capsule  11  . lansoprazole (PREVACID) 30 MG capsule TAKE ONE CAPSULE TWICE A DAY  180 capsule  1  . methadone (DOLOPHINE) 10 MG tablet Take 1 tablet (10 mg total) by mouth 3 (three) times daily. Fill 08/05/2012  90 tablet  0  . PARoxetine (PAXIL) 40 MG tablet Take 1 tablet (40 mg total) by mouth every morning.  90 tablet  4  . QUEtiapine (SEROQUEL) 50 MG tablet Take 50 mg by mouth at bedtime.       No facility-administered encounter medications on file as of 07/09/2012.    Review of Systems  Constitutional: Negative for fever, chills, activity change, appetite change, fatigue and unexpected weight change.  Eyes: Negative for visual disturbance.  Respiratory: Negative for cough and shortness of breath.   Cardiovascular: Negative for chest pain,  palpitations and leg swelling.  Gastrointestinal: Negative for abdominal pain and abdominal distention.  Genitourinary: Negative for dysuria, urgency and difficulty urinating.  Musculoskeletal: Positive for myalgias and arthralgias. Negative for gait problem.  Skin: Negative for color change and rash.  Hematological: Negative for adenopathy.  Psychiatric/Behavioral: Negative for sleep disturbance and dysphoric mood. The patient is not nervous/anxious.        Objective:   Physical Exam  Constitutional: He is oriented to person, place, and time. He appears well-developed and well-nourished. No distress.  HENT:  Head: Normocephalic and atraumatic.  Right Ear: External ear normal.  Left Ear: External ear normal.  Nose: Nose normal.  Mouth/Throat: Oropharynx is clear and moist.  Eyes: Conjunctivae and EOM are normal. Pupils are equal, round, and reactive to light. Right eye exhibits no discharge. Left eye exhibits no discharge. No scleral icterus.  Neck: Normal range of motion. Neck supple. No tracheal deviation present. No thyromegaly present.  Cardiovascular: Normal rate.  Exam reveals no friction rub.   Pulmonary/Chest: Effort normal.  Musculoskeletal: He exhibits no edema.       Left knee: He exhibits decreased range of motion, swelling and erythema.  Lymphadenopathy:    He has no cervical adenopathy.  Neurological: He is alert and oriented to person, place, and time. No cranial nerve deficit. Coordination normal.  Skin: Skin is warm and dry. No rash noted. He  is not diaphoretic. No erythema. No pallor.  Psychiatric: He has a normal mood and affect. His behavior is normal. Judgment and thought content normal.          Assessment & Plan:

## 2012-09-03 ENCOUNTER — Telehealth: Payer: Self-pay | Admitting: Internal Medicine

## 2012-09-03 DIAGNOSIS — G8929 Other chronic pain: Secondary | ICD-10-CM

## 2012-09-03 NOTE — Telephone Encounter (Signed)
Methadone refill needed

## 2012-09-03 NOTE — Telephone Encounter (Signed)
Fine to fill. Needs to come in for UDS and to sign contract

## 2012-09-03 NOTE — Telephone Encounter (Signed)
Ok to refill?, Received rx in June with notation do not fill until 7/8

## 2012-09-04 MED ORDER — METHADONE HCL 10 MG PO TABS: 10.0000 mg | ORAL_TABLET | Freq: Three times a day (TID) | ORAL | Status: DC

## 2012-09-04 NOTE — Telephone Encounter (Signed)
Left message informing patient we will refill prescription but he has to come pick it up with ID.

## 2012-09-16 ENCOUNTER — Encounter: Payer: Self-pay | Admitting: Internal Medicine

## 2012-10-16 ENCOUNTER — Telehealth: Payer: Self-pay | Admitting: Internal Medicine

## 2012-10-16 DIAGNOSIS — G8929 Other chronic pain: Secondary | ICD-10-CM

## 2012-10-16 NOTE — Telephone Encounter (Signed)
Pt came in today wanting dr walker to write him a refill rx for methadyone Pt has 1 week worth left  Pt stated his pain dr wrote rx for 10mg  but it was oct not sept Pt stated dr walker knows he is trying to get off this.  He wanted to know if she could write rx for 5mg   Please advise

## 2012-10-16 NOTE — Telephone Encounter (Signed)
Fwd to Dr. Walker 

## 2012-10-16 NOTE — Telephone Encounter (Signed)
We can refill his medication, however I would encourage him to talk to his pain management physician prior to trying to taper Methadone.

## 2012-10-17 MED ORDER — METHADONE HCL 10 MG PO TABS: 10.0000 mg | ORAL_TABLET | Freq: Three times a day (TID) | ORAL | Status: DC

## 2012-10-17 NOTE — Telephone Encounter (Signed)
Patient informed and has already spoken with pain doctor about this.

## 2012-12-08 ENCOUNTER — Encounter: Payer: BC Managed Care – PPO | Admitting: Internal Medicine

## 2012-12-10 ENCOUNTER — Encounter: Payer: BC Managed Care – PPO | Admitting: Internal Medicine

## 2013-03-03 ENCOUNTER — Ambulatory Visit (INDEPENDENT_AMBULATORY_CARE_PROVIDER_SITE_OTHER): Payer: BC Managed Care – PPO | Admitting: Family Medicine

## 2013-03-03 ENCOUNTER — Encounter: Payer: Self-pay | Admitting: Family Medicine

## 2013-03-03 ENCOUNTER — Encounter: Payer: Self-pay | Admitting: *Deleted

## 2013-03-03 VITALS — BP 138/82 | HR 72 | Temp 98.2°F | Wt 203.5 lb

## 2013-03-03 DIAGNOSIS — J019 Acute sinusitis, unspecified: Secondary | ICD-10-CM

## 2013-03-03 DIAGNOSIS — B9689 Other specified bacterial agents as the cause of diseases classified elsewhere: Secondary | ICD-10-CM

## 2013-03-03 MED ORDER — AMOXICILLIN-POT CLAVULANATE 875-125 MG PO TABS: 1.0000 | ORAL_TABLET | Freq: Two times a day (BID) | ORAL | Status: AC

## 2013-03-03 NOTE — Progress Notes (Signed)
Pre-visit discussion using our clinic review tool. No additional management support is needed unless otherwise documented below in the visit note.  

## 2013-03-03 NOTE — Patient Instructions (Signed)
You have a sinus infection. Take medicine as prescribed: augmentin twice daily for 10 days Push fluids and plenty of rest. Nasal saline irrigation or neti pot to help drain sinuses. May use plain mucinex or immediate release guaifenesin with plenty of fluid to help mobilize mucous. May use ibuprofen for sinus inflammation with meals as needed. Let us know if fever >101.5, trouble opening/closing mouth, difficulty swallowing, or worsening - you may need to be seen again.

## 2013-03-03 NOTE — Progress Notes (Signed)
   Subjective:    Patient ID: Bryan Hale, male    DOB: Dec 16, 1950, 63 y.o.   MRN: [id number]  HPI CC: "I feel like death"  9d h/o cold sxs.  Started with ST, that progressed to significant head congestion and pressure headache.  Yesterday with worsening head congestion, dizziness, and ringing in ears, also with chest congestion/tightness, feverish.  Cough productive of clear mucous.  Feeling weak from feeling ill.  Some nausea and decreased appetite. Fatigued.  In bed all weekend.  No rhinorrhea or PNdrainage, ear or tooth pain, abd pain, rashes. So far has tried OTC remedies which haven't helped. Ex smoker - quit 2012. Wife sick recently - with sinus infection. No h/o asthma Did not receive flu shot this year.  Past Medical History  Diagnosis Date  . Chronic back pain   . Skin cancer   . Anxiety   . Gout   . Vitamin D deficiency   . Hyperlipidemia   . Chronic pain   . Esophageal reflux   . Sleep apnea, obstructive     starting BiPAP     Review of Systems Per HPI    Objective:   Physical Exam  Nursing note and vitals reviewed. Constitutional: He appears well-developed and well-nourished. No distress.  HENT:  Head: Normocephalic and atraumatic.  Right Ear: Hearing, tympanic membrane, external ear and ear canal normal.  Left Ear: Hearing, tympanic membrane, external ear and ear canal normal.  Nose: Mucosal edema present. No rhinorrhea. Right sinus exhibits no maxillary sinus tenderness and no frontal sinus tenderness. Left sinus exhibits no maxillary sinus tenderness and no frontal sinus tenderness.  Mouth/Throat: Uvula is midline and mucous membranes are normal. Posterior oropharyngeal erythema present. No oropharyngeal exudate, posterior oropharyngeal edema or tonsillar abscesses.  Congested TM on right R>L nasal mucosal irritation  Eyes: Conjunctivae and EOM are normal. Pupils are equal, round, and reactive to light. No scleral icterus.  Neck: Normal range of  motion. Neck supple.  Cardiovascular: Normal rate, regular rhythm, normal heart sounds and intact distal pulses.   No murmur heard. Pulmonary/Chest: Effort normal and breath sounds normal. No respiratory distress. He has no wheezes. He has no rales.  Lymphadenopathy:    He has no cervical adenopathy.  Skin: Skin is warm and dry. No rash noted.       Assessment & Plan:

## 2013-03-03 NOTE — Assessment & Plan Note (Signed)
Given duration and progression of sxs anticipate has developed bacterial sinus infection Treat as such with augmentin 10d course Update if sxs persist or deteriorate. Pt agrees with plan.

## 2013-03-04 ENCOUNTER — Telehealth: Payer: Self-pay | Admitting: Internal Medicine

## 2013-03-04 NOTE — Telephone Encounter (Signed)
Diane (spouse) left message on 03/02/13.  I returned her call left message for her to call office

## 2013-07-02 ENCOUNTER — Ambulatory Visit: Payer: BC Managed Care – PPO | Admitting: Internal Medicine

## 2013-07-06 ENCOUNTER — Other Ambulatory Visit: Payer: Self-pay | Admitting: Internal Medicine

## 2013-09-14 ENCOUNTER — Telehealth: Payer: Self-pay | Admitting: Internal Medicine

## 2013-09-14 NOTE — Telephone Encounter (Signed)
The patient is needing a sooner appointment for his depression and anxiety. Please advise.

## 2013-09-14 NOTE — Telephone Encounter (Signed)
See below note.

## 2013-09-14 NOTE — Telephone Encounter (Signed)
Can we add him at 11am on August 27th? 43min

## 2013-09-14 NOTE — Telephone Encounter (Signed)
Patient has been scheduled

## 2013-09-24 ENCOUNTER — Ambulatory Visit (INDEPENDENT_AMBULATORY_CARE_PROVIDER_SITE_OTHER): Payer: BC Managed Care – PPO | Admitting: Internal Medicine

## 2013-09-24 ENCOUNTER — Telehealth: Payer: Self-pay | Admitting: Internal Medicine

## 2013-09-24 ENCOUNTER — Encounter: Payer: Self-pay | Admitting: Internal Medicine

## 2013-09-24 VITALS — BP 162/80 | HR 72 | Ht 69.5 in | Wt 197.5 lb

## 2013-09-24 DIAGNOSIS — G8929 Other chronic pain: Secondary | ICD-10-CM

## 2013-09-24 DIAGNOSIS — F411 Generalized anxiety disorder: Secondary | ICD-10-CM | POA: Insufficient documentation

## 2013-09-24 MED ORDER — GABAPENTIN 100 MG PO CAPS
100.0000 mg | ORAL_CAPSULE | Freq: Three times a day (TID) | ORAL | Status: DC
Start: 2013-09-24 — End: 2013-10-07

## 2013-09-24 MED ORDER — LORAZEPAM 1 MG PO TABS: 1.0000 mg | ORAL_TABLET | Freq: Three times a day (TID) | ORAL | Status: DC | PRN

## 2013-09-24 MED ORDER — LANSOPRAZOLE 30 MG PO CPDR: 30.0000 mg | DELAYED_RELEASE_CAPSULE | Freq: Two times a day (BID) | ORAL | Status: DC

## 2013-09-24 MED ORDER — SERTRALINE HCL 100 MG PO TABS: 150.0000 mg | ORAL_TABLET | Freq: Every day | ORAL | Status: DC

## 2013-09-24 NOTE — Progress Notes (Signed)
Pre visit review using our clinic review tool, if applicable. No additional management support is needed unless otherwise documented below in the visit note. 

## 2013-09-24 NOTE — Assessment & Plan Note (Signed)
Generalized anxiety, severe. Will increase Sertraline to 150mg  daily. Change Clonazepam to Lorazepam 1mg  po tid prn. Discussed potential risks of sedation with this medication. Will place psych referral. Follow up here in 2 weeks or sooner as needed.  Over 40min of which >50% spent in face-to-face contact with patient discussing plan of care

## 2013-09-24 NOTE — Progress Notes (Signed)
Subjective:    Patient ID: Bryan Hale, male    DOB: 06-Jan-1951, 63 y.o.   MRN: [id number]  HPI 63YO male presents for follow up. Difficult time for him. 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 Had been on Methadone for 8 years. During taper, he "felt horrible" with sweats, fatigue, increase pain and anxiety. Those symptoms have improved except for anxiety. He feels anxious every day about "everything." He finds it hard to go to work. 6 weeks ago his father died and this seemed to make symptoms even worse. His wife also left on a vacation and this has increased anxiety. He went to [facility] and they started him on Sertraline and Clonazepam. He feels that this medication has not been helpful. He has trouble sleeping and focusing at work. He has occasional symptoms of depression, but predominately anxiety.  Review of Systems  Constitutional: Positive for fatigue. Negative for fever, chills, activity change, appetite change and unexpected weight change.  Eyes: Negative for visual disturbance.  Respiratory: Negative for cough and shortness of breath.   Cardiovascular: Negative for chest pain, palpitations and leg swelling.  Gastrointestinal: Negative for abdominal pain and abdominal distention.  Genitourinary: Negative for dysuria, urgency and difficulty urinating.  Musculoskeletal: Positive for arthralgias and myalgias. Negative for gait problem.  Skin: Negative for color change and rash.  Hematological: Negative for adenopathy.  Psychiatric/Behavioral: Positive for sleep disturbance, dysphoric mood, decreased concentration and agitation. Negative for suicidal ideas, hallucinations and self-injury. The patient is nervous/anxious. The patient is not hyperactive.        Objective:    BP 162/80  Pulse 72  Ht 5' 9.5" (1.765 m)  Wt 197 lb 8 oz (89.585 kg)  BMI 28.76 kg/m2  SpO2 97% Physical Exam    Constitutional: He is oriented to person, place, and time. He appears well-developed and well-nourished. No distress.  HENT:  Head: Normocephalic and atraumatic.  Right Ear: External ear normal.  Left Ear: External ear normal.  Nose: Nose normal.  Mouth/Throat: Oropharynx is clear and moist.  Eyes: Conjunctivae and EOM are normal. Pupils are equal, round, and reactive to light. Right eye exhibits no discharge. Left eye exhibits no discharge. No scleral icterus.  Neck: Normal range of motion. Neck supple. No tracheal deviation present. No thyromegaly present.  Cardiovascular: Normal rate, regular rhythm and normal heart sounds.  Exam reveals no gallop and no friction rub.   No murmur heard. Pulmonary/Chest: Effort normal and breath sounds normal. No accessory muscle usage. Not tachypneic. No respiratory distress. He has no decreased breath sounds. He has no wheezes. He has no rhonchi. He has no rales. He exhibits no tenderness.  Musculoskeletal: Normal range of motion. He exhibits no edema.  Lymphadenopathy:    He has no cervical adenopathy.  Neurological: He is alert and oriented to person, place, and time. No cranial nerve deficit. Coordination normal.  Skin: Skin is warm and dry. No rash noted. He is not diaphoretic. No erythema. No pallor.  Psychiatric: His speech is normal and behavior is normal. Judgment and thought content normal. His mood appears anxious. He expresses no suicidal ideation.          Assessment & Plan:   Problem List Items Addressed This Visit     Unprioritized   Chronic pain     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 Currently using only prn Motrin for pain. Will start Neurontin  100mg  po tid and taper up for better pain control. He tolerated this medication weill in the past. Follow up 2 weeks and prn.    Relevant Medications      gabapentin (NEURONTIN) capsule      sertraline (ZOLOFT) tablet   Generalized anxiety disorder -  Primary     Generalized anxiety, severe. Will increase Sertraline to 150mg  daily. Change Clonazepam to Lorazepam 1mg  po tid prn. Discussed potential risks of sedation with this medication. Will place psych referral. Follow up here in 2 weeks or sooner as needed.  Over 84min of which >50% spent in face-to-face contact with patient discussing plan of care     Relevant Medications      LORazepam (ATIVAN) tablet      sertraline (ZOLOFT) tablet   Other Relevant Orders      Ambulatory referral to Psychiatry       Return in about 2 weeks (around 10/08/2013) for Recheck.

## 2013-09-24 NOTE — Patient Instructions (Signed)
Start taking Zoloft (Sertraline) 150mg  at bedtime.  Use Ativan 1mg  as needed up to three times daily for anxiety.  Start Neurontin (Gabapentin) 100mg  three times daily. Email with update Monday and we will titrate up dose.  Follow up in 2 weeks.

## 2013-09-24 NOTE — Telephone Encounter (Signed)
1pm on Wed Sept 9th 22min

## 2013-09-24 NOTE — Assessment & Plan Note (Signed)
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 Currently using only prn Motrin for pain. Will start Neurontin 100mg  po tid and taper up for better pain control. He tolerated this medication weill in the past. Follow up 2 weeks and prn.

## 2013-09-24 NOTE — Telephone Encounter (Signed)
Pt needs 2 week follow up (30 mins) please advise where to add pt.

## 2013-09-28 ENCOUNTER — Encounter: Payer: Self-pay | Admitting: Internal Medicine

## 2013-10-07 ENCOUNTER — Ambulatory Visit: Payer: BC Managed Care – PPO | Admitting: Internal Medicine

## 2013-10-07 ENCOUNTER — Encounter: Payer: Self-pay | Admitting: Internal Medicine

## 2013-10-07 ENCOUNTER — Ambulatory Visit (INDEPENDENT_AMBULATORY_CARE_PROVIDER_SITE_OTHER): Payer: BC Managed Care – PPO | Admitting: Internal Medicine

## 2013-10-07 VITALS — BP 160/80 | HR 77 | Temp 98.6°F | Ht 69.5 in | Wt 196.2 lb

## 2013-10-07 DIAGNOSIS — L989 Disorder of the skin and subcutaneous tissue, unspecified: Secondary | ICD-10-CM

## 2013-10-07 DIAGNOSIS — F411 Generalized anxiety disorder: Secondary | ICD-10-CM

## 2013-10-07 DIAGNOSIS — G8929 Other chronic pain: Secondary | ICD-10-CM

## 2013-10-07 MED ORDER — GABAPENTIN 300 MG PO CAPS: 300.0000 mg | ORAL_CAPSULE | Freq: Three times a day (TID) | ORAL | Status: DC

## 2013-10-07 NOTE — Progress Notes (Signed)
Subjective:    Patient ID: Bryan Hale, male    DOB: 23-Nov-1950, 63 y.o.   MRN: [id number]  HPI 63YO male presents for follow up.  Anxiety - Symptoms are much improved on current medications. He continues to have some pain in his ankle on the current dose of neurontin, however this is improved compared to previous.  He is concerned about several skin lesions on his face, shoulder and back. He has a history of melanoma on his back which was removed by Dr. Nehemiah Massed in the past.  Review of Systems  Constitutional: Negative for fever, chills, activity change, appetite change, fatigue and unexpected weight change.  Eyes: Negative for visual disturbance.  Respiratory: Negative for cough and shortness of breath.   Cardiovascular: Negative for chest pain, palpitations and leg swelling.  Gastrointestinal: Negative for abdominal pain and abdominal distention.  Genitourinary: Negative for dysuria, urgency and difficulty urinating.  Musculoskeletal: Positive for myalgias. Negative for arthralgias and gait problem.  Skin: Negative for color change and rash.  Hematological: Negative for adenopathy.  Psychiatric/Behavioral: Negative for sleep disturbance and dysphoric mood. The patient is not nervous/anxious.        Objective:    BP 160/80  Pulse 77  Temp(Src) 98.6 F (37 C) (Oral)  Ht 5' 9.5" (1.765 m)  Wt 196 lb 4 oz (89.018 kg)  BMI 28.58 kg/m2  SpO2 96% Physical Exam  Constitutional: He is oriented to person, place, and time. He appears well-developed and well-nourished. No distress.  HENT:  Head: Normocephalic and atraumatic.  Right Ear: External ear normal.  Left Ear: External ear normal.  Nose: Nose normal.  Mouth/Throat: Oropharynx is clear and moist. No oropharyngeal exudate.  Eyes: Conjunctivae and EOM are normal. Pupils are equal, round, and reactive to light. Right eye exhibits no discharge. Left eye exhibits no discharge. No scleral icterus.  Neck: Normal range of  motion. Neck supple. No tracheal deviation present. No thyromegaly present.  Cardiovascular: Normal rate, regular rhythm and normal heart sounds.  Exam reveals no gallop and no friction rub.   No murmur heard. Pulmonary/Chest: Effort normal and breath sounds normal. No accessory muscle usage. Not tachypneic. No respiratory distress. He has no decreased breath sounds. He has no wheezes. He has no rhonchi. He has no rales. He exhibits no tenderness.  Musculoskeletal: Normal range of motion. He exhibits no edema.  Lymphadenopathy:    He has no cervical adenopathy.  Neurological: He is alert and oriented to person, place, and time. No cranial nerve deficit. Coordination normal.  Skin: Skin is warm and dry. No rash noted. He is not diaphoretic. No erythema. No pallor.     Psychiatric: He has a normal mood and affect. His speech is normal and behavior is normal. Judgment and thought content normal. His mood appears not anxious. He does not exhibit a depressed mood.          Assessment & Plan:   Problem List Items Addressed This Visit     Unprioritized   Chronic pain     Chronic pain ankle after crush injury. Symptoms improved with addition of Neurontin, however will increase dose to 300mg  tid for better control.    Relevant Medications      gabapentin (NEURONTIN) capsule   Generalized anxiety disorder - Primary     Symptoms improved with use of Sertraline and prn Lorazepam. Will continue.    Skin lesion     Skin lesion right anterior shoulder concerning for BCC. Several hyperpigmented papules  over trunk most c/w SK. Will set up follow up with Dr. Nehemiah Massed for evaluation.    Relevant Orders      Ambulatory referral to Dermatology       Return in about 3 months (around 01/06/2014) for Recheck.

## 2013-10-07 NOTE — Assessment & Plan Note (Signed)
Skin lesion right anterior shoulder concerning for BCC. Several hyperpigmented papules over trunk most c/w SK. Will set up follow up with Dr. Nehemiah Massed for evaluation.

## 2013-10-07 NOTE — Progress Notes (Signed)
Pre visit review using our clinic review tool, if applicable. No additional management support is needed unless otherwise documented below in the visit note. 

## 2013-10-07 NOTE — Patient Instructions (Signed)
Increase Neurontin to 300mg  three times daily.  Follow up in 3 months or as needed.

## 2013-10-07 NOTE — Assessment & Plan Note (Signed)
Chronic pain ankle after crush injury. Symptoms improved with addition of Neurontin, however will increase dose to 300mg  tid for better control.

## 2013-10-07 NOTE — Assessment & Plan Note (Signed)
Symptoms improved with use of Sertraline and prn Lorazepam. Will continue.

## 2014-01-06 ENCOUNTER — Ambulatory Visit: Payer: BC Managed Care – PPO | Admitting: Internal Medicine

## 2014-01-20 ENCOUNTER — Other Ambulatory Visit: Payer: Self-pay | Admitting: Internal Medicine

## 2014-01-20 NOTE — Telephone Encounter (Signed)
Rx faxed

## 2014-01-20 NOTE — Telephone Encounter (Signed)
Last refill 11.23.15, last OV 9.9.15 however several NO SHOWS and Cancellations.  Please advise refill

## 2014-03-15 ENCOUNTER — Other Ambulatory Visit: Payer: Self-pay | Admitting: Internal Medicine

## 2014-03-15 ENCOUNTER — Telehealth: Payer: Self-pay

## 2014-03-15 NOTE — Telephone Encounter (Signed)
PA started for lansoprazole. Pa has been denied in the past. Awaiting response at this time.

## 2014-03-19 NOTE — Telephone Encounter (Signed)
PA for lansorprazole has been denied.

## 2014-04-09 ENCOUNTER — Other Ambulatory Visit: Payer: Self-pay | Admitting: Internal Medicine

## 2014-05-01 ENCOUNTER — Other Ambulatory Visit: Payer: Self-pay | Admitting: Internal Medicine

## 2014-05-01 NOTE — Telephone Encounter (Signed)
Okay to refill? 

## 2014-05-04 ENCOUNTER — Encounter: Payer: Self-pay | Admitting: Internal Medicine

## 2014-06-09 ENCOUNTER — Encounter: Payer: Self-pay | Admitting: Internal Medicine

## 2014-07-15 ENCOUNTER — Ambulatory Visit (AMBULATORY_SURGERY_CENTER): Payer: Self-pay | Admitting: *Deleted

## 2014-07-15 VITALS — Ht 70.0 in | Wt 194.0 lb

## 2014-07-15 DIAGNOSIS — Z1211 Encounter for screening for malignant neoplasm of colon: Secondary | ICD-10-CM

## 2014-07-15 MED ORDER — NA SULFATE-K SULFATE-MG SULF 17.5-3.13-1.6 GM/177ML PO SOLN: 1.0000 | Freq: Once | ORAL | Status: DC

## 2014-07-15 NOTE — Progress Notes (Signed)
No egg or soy allergy. No anesthesia problems.  No home O2.  No diet meds.  

## 2014-07-20 ENCOUNTER — Encounter: Payer: Self-pay | Admitting: Internal Medicine

## 2014-07-23 ENCOUNTER — Encounter: Payer: Self-pay | Admitting: Internal Medicine

## 2014-07-30 ENCOUNTER — Encounter: Payer: Self-pay | Admitting: Internal Medicine

## 2014-07-30 ENCOUNTER — Ambulatory Visit (AMBULATORY_SURGERY_CENTER): Payer: BC Managed Care – PPO | Admitting: Internal Medicine

## 2014-07-30 VITALS — BP 138/64 | HR 50 | Temp 95.2°F | Resp 17 | Ht 70.0 in | Wt 194.0 lb

## 2014-07-30 DIAGNOSIS — D125 Benign neoplasm of sigmoid colon: Secondary | ICD-10-CM

## 2014-07-30 DIAGNOSIS — Z1211 Encounter for screening for malignant neoplasm of colon: Secondary | ICD-10-CM | POA: Diagnosis present

## 2014-07-30 MED ORDER — SODIUM CHLORIDE 0.9 % IV SOLN: 500.0000 mL | INTRAVENOUS | Status: DC

## 2014-07-30 NOTE — Op Note (Signed)
[place]  [address] [place], [id number]   COLONOSCOPY PROCEDURE REPORT  PATIENT: Bryan Hale, Bryan Hale  MR#: [id number] BIRTHDATE: December 14, 1950 , 59  yrs. old GENDER: male ENDOSCOPIST: Lafayette Dragon, MD REFERRED BY:Dr Ronette Deter PROCEDURE DATE:  07/30/2014 PROCEDURE:   Colonoscopy, screening and Colonoscopy with snare polypectomy First Screening Colonoscopy - Avg.  risk and is 50 yrs.  old or older - No.  Prior Negative Screening - Now for repeat screening. 10 or more years since last screening  History of Adenoma - Now for follow-up colonoscopy & has been > or = to 3 yrs.  Yes hx of adenoma.  Has been 3 or more years since last colonoscopy.  History of Adenoma - Now for follow-up colonoscopy & has been > or = to 3 yrs.  N/A  Polyps removed today? Yes ASA CLASS:   Class II INDICATIONS:Screening for colonic neoplasia, Colorectal Neoplasm Risk Assessment for this procedure is average risk, and prior colonoscopy in 2005 was normal, except for mild diverticulosis. MEDICATIONS: Monitored anesthesia care and Propofol 200 mg IV  DESCRIPTION OF PROCEDURE:   After the risks benefits and alternatives of the procedure were thoroughly explained, informed consent was obtained.  The digital rectal exam revealed no abnormalities of the rectum.   The LB [id number]  endoscope was introduced through the anus and advanced to the cecum, which was identified by both the appendix and ileocecal valve. No adverse events experienced.   The quality of the prep was good.  (MoviPrep was used)  The instrument was then slowly withdrawn as the colon was fully examined. Estimated blood loss is zero unless otherwise noted in this procedure report.      COLON FINDINGS: A fungating semi-pedunculated polyp measuring 14 mm in size was found in the sigmoid colon.  A polypectomy was performed using snare cautery.  The resection was complete, the polyp tissue was completely retrieved  and sent to histology. Retroflexed views revealed no abnormalities. The time to cecum = 3.49 Withdrawal time = 6.00   The scope was withdrawn and the procedure completed. COMPLICATIONS: There were no immediate complications.  ENDOSCOPIC IMPRESSION: Semi-pedunculated polyp was found in the sigmoid colon; polypectomy was performed using snare cautery scattered diverticuli sigmoid colon  RECOMMENDATIONS: 1.  Await pathology results 2.  High fiber diet No aspirin all entirely inflammatory agents for 2 weeks 3.recall colonoscopy pending path report  eSigned:  Lafayette Dragon, MD 07/30/2014 12:38 PM   cc:   PATIENT NAME:  Beckam, Abdulaziz MR#: [id number]

## 2014-07-30 NOTE — Progress Notes (Signed)
Transferred to recovery room. A/O x3, pleased with MAC.  VSS.  Report to Suzanne, RN. 

## 2014-07-30 NOTE — Patient Instructions (Signed)
YOU HAD AN ENDOSCOPIC PROCEDURE TODAY AT [facility]:   Refer to the procedure report that was given to you for any specific questions about what was found during the examination.  If the procedure report does not answer your questions, please call your gastroenterologist to clarify.  If you requested that your care partner not be given the details of your procedure findings, then the procedure report has been included in a sealed envelope for you to review at your convenience later.  YOU SHOULD EXPECT: Some feelings of bloating in the abdomen. Passage of more gas than usual.  Walking can help get rid of the air that was put into your GI tract during the procedure and reduce the bloating. If you had a lower endoscopy (such as a colonoscopy or flexible sigmoidoscopy) you may notice spotting of blood in your stool or on the toilet paper. If you underwent a bowel prep for your procedure, you may not have a normal bowel movement for a few days.  Please Note:  You might notice some irritation and congestion in your nose or some drainage.  This is from the oxygen used during your procedure.  There is no need for concern and it should clear up in a day or so.  SYMPTOMS TO REPORT IMMEDIATELY:   Following lower endoscopy (colonoscopy or flexible sigmoidoscopy):  Excessive amounts of blood in the stool  Significant tenderness or worsening of abdominal pains  Swelling of the abdomen that is new, acute  Fever of 100F or higher   For urgent or emergent issues, a gastroenterologist can be reached at any hour by calling [phone].   DIET: Your first meal following the procedure should be a small meal and then it is ok to progress to your normal diet. Heavy or fried foods are harder to digest and may make you feel nauseous or bloated.  Likewise, meals heavy in dairy and vegetables can increase bloating.  Drink plenty of fluids but you should avoid alcoholic beverages for 24 hours. Try to  increase the fiber in your diet.  ACTIVITY:  You should plan to take it easy for the rest of today and you should NOT DRIVE or use heavy machinery until tomorrow (because of the sedation medicines used during the test).    FOLLOW UP: Our staff will call the number listed on your records the next business day following your procedure to check on you and address any questions or concerns that you may have regarding the information given to you following your procedure. If we do not reach you, we will leave a message.  However, if you are feeling well and you are not experiencing any problems, there is no need to return our call.  We will assume that you have returned to your regular daily activities without incident.  If any biopsies were taken you will be contacted by phone or by letter within the next 1-3 weeks.  Please call us at [phone] if you have not heard about the biopsies in 3 weeks.    SIGNATURES/CONFIDENTIALITY: You and/or your care partner have signed paperwork which will be entered into your electronic medical record.  These signatures attest to the fact that that the information above on your After Visit Summary has been reviewed and is understood.  Full responsibility of the confidentiality of this discharge information lies with you and/or your care-partner.  Hold all aspirin and NSAIDS products for two weeks due to the removal of the polyp  with cautery.  Read all of the handouts given to you by your recovery room nurse.

## 2014-07-30 NOTE — Progress Notes (Signed)
Called to room to assist during endoscopic procedure.  Patient ID and intended procedure confirmed with present staff. Received instructions for my participation in the procedure from the performing physician.  

## 2014-08-03 ENCOUNTER — Telehealth: Payer: Self-pay

## 2014-08-03 NOTE — Telephone Encounter (Signed)
No answer, left voicemail

## 2014-08-05 ENCOUNTER — Other Ambulatory Visit: Payer: Self-pay | Admitting: Internal Medicine

## 2014-08-05 ENCOUNTER — Encounter: Payer: Self-pay | Admitting: Internal Medicine

## 2014-09-02 ENCOUNTER — Ambulatory Visit (INDEPENDENT_AMBULATORY_CARE_PROVIDER_SITE_OTHER): Payer: BC Managed Care – PPO | Admitting: Internal Medicine

## 2014-09-02 ENCOUNTER — Encounter: Payer: Self-pay | Admitting: Internal Medicine

## 2014-09-02 VITALS — BP 151/78 | HR 62 | Temp 98.2°F | Wt 197.0 lb

## 2014-09-02 DIAGNOSIS — H6690 Otitis media, unspecified, unspecified ear: Secondary | ICD-10-CM | POA: Insufficient documentation

## 2014-09-02 DIAGNOSIS — H6692 Otitis media, unspecified, left ear: Secondary | ICD-10-CM | POA: Diagnosis not present

## 2014-09-02 DIAGNOSIS — F329 Major depressive disorder, single episode, unspecified: Secondary | ICD-10-CM | POA: Diagnosis not present

## 2014-09-02 DIAGNOSIS — R5382 Chronic fatigue, unspecified: Secondary | ICD-10-CM | POA: Insufficient documentation

## 2014-09-02 DIAGNOSIS — F32A Depression, unspecified: Secondary | ICD-10-CM

## 2014-09-02 MED ORDER — DULOXETINE HCL 30 MG PO CPEP: 30.0000 mg | ORAL_CAPSULE | Freq: Every day | ORAL | Status: DC

## 2014-09-02 MED ORDER — LEVOFLOXACIN 500 MG PO TABS: 500.0000 mg | ORAL_TABLET | Freq: Every day | ORAL | Status: DC

## 2014-09-02 NOTE — Assessment & Plan Note (Signed)
Recent fatigue likely related to depression, however will check labs including CBC, CMP, TSH, B12.

## 2014-09-02 NOTE — Assessment & Plan Note (Addendum)
Recent worsening symptoms of depression. PHQ9= 13. No improvement with Sertraline. Discussed some options for treatment. Will add Cymbalta. Plan to taper Sertraline over next few weeks.He will contract for safety. Follow up in 2 weeks and prn.

## 2014-09-02 NOTE — Patient Instructions (Addendum)
Start Cymbalta 30mg  daily.  Continue Sertraline 100mg  daily. We will plan to taper this over the next few months.  Labs today.  Stop Amoxicillin. Start Levaquin for left ear infection.  Follow up in 2 weeks

## 2014-09-02 NOTE — Assessment & Plan Note (Signed)
Left OM on exam. No improvement with Augmentin prescribed at urgent care. Will change to Levaquin. Continue probiotic while on antibiotics. Follow up in 2 weeks and prn.

## 2014-09-02 NOTE — Progress Notes (Signed)
Pre visit review using our clinic review tool, if applicable. No additional management support is needed unless otherwise documented below in the visit note. 

## 2014-09-02 NOTE — Progress Notes (Addendum)
Subjective:    Patient ID: Bryan Hale, male    DOB: September 04, 1950, 64 y.o.   MRN: [id number]  HPI  64YO male presents for acute visit.  Depression - Notes depressed mood, fatigue, irritability. Wakes up feeling anxious, panicked.  In past used Neurontin for anxiety with some improvement. Feels like Sertraline is not helpful. Paxil was helpful in distant past. Frustrated at work. Continues to have pain in ankle, however in past this was not improved with Methadone.  Recently treated for left otitis media with Augmentin. No improvement. Notes severe pain in left ear, especially at night. No fever, chills.  Past medical, surgical, family and social history per today's encounter.   Review of Systems  Constitutional: Positive for fatigue. Negative for fever, chills, activity change, appetite change and unexpected weight change.  HENT: Positive for ear pain. Negative for congestion, ear discharge, hearing loss, postnasal drip, rhinorrhea, sinus pressure, sneezing, sore throat, tinnitus, trouble swallowing and voice change.   Eyes: Negative for visual disturbance.  Respiratory: Negative for cough and shortness of breath.   Cardiovascular: Negative for chest pain, palpitations and leg swelling.  Gastrointestinal: Negative for abdominal pain and abdominal distention.  Genitourinary: Negative for dysuria, urgency and difficulty urinating.  Musculoskeletal: Negative for arthralgias and gait problem.  Skin: Negative for color change and rash.  Hematological: Negative for adenopathy.  Psychiatric/Behavioral: Positive for dysphoric mood. Negative for suicidal ideas and sleep disturbance. The patient is not nervous/anxious.        Objective:    BP 151/78 mmHg  Pulse 62  Temp(Src) 98.2 F (36.8 C) (Oral)  Wt 197 lb (89.359 kg)  SpO2 94% Physical Exam  Constitutional: He is oriented to person, place, and time. He appears well-developed and well-nourished. No distress.  HENT:  Head:  Normocephalic and atraumatic.  Right Ear: Tympanic membrane and external ear normal.  Left Ear: External ear normal. Tympanic membrane is erythematous and bulging. A middle ear effusion is present.  Nose: Nose normal.  Mouth/Throat: Oropharynx is clear and moist. No oropharyngeal exudate.  Eyes: Conjunctivae and EOM are normal. Pupils are equal, round, and reactive to light. Right eye exhibits no discharge. Left eye exhibits no discharge. No scleral icterus.  Neck: Normal range of motion. Neck supple. No tracheal deviation present. No thyromegaly present.  Cardiovascular: Normal rate, regular rhythm and normal heart sounds.  Exam reveals no gallop and no friction rub.   No murmur heard. Pulmonary/Chest: Effort normal and breath sounds normal. No accessory muscle usage. No tachypnea. No respiratory distress. He has no decreased breath sounds. He has no wheezes. He has no rhonchi. He has no rales. He exhibits no tenderness.  Musculoskeletal: Normal range of motion. He exhibits no edema.  Lymphadenopathy:    He has no cervical adenopathy.  Neurological: He is alert and oriented to person, place, and time. No cranial nerve deficit. Coordination normal.  Skin: Skin is warm and dry. No rash noted. He is not diaphoretic. No erythema. No pallor.  Psychiatric: His speech is normal and behavior is normal. Judgment and thought content normal. He exhibits a depressed mood. He expresses no suicidal ideation.  PHQ9 13.          Assessment & Plan:   Problem List Items Addressed This Visit      Unprioritized   Chronic fatigue    Recent fatigue likely related to depression, however will check labs including CBC, CMP, TSH, B12.      Relevant Orders   Comprehensive metabolic  panel   B12   CBC   TSH   Depression - Primary    Recent worsening symptoms of depression. PHQ9= 13. No improvement with Sertraline. Discussed some options for treatment. Will add Cymbalta. Plan to taper Sertraline over next  few weeks.He will contract for safety. Follow up in 2 weeks and prn.      Relevant Medications   DULoxetine (CYMBALTA) 30 MG capsule   Otitis media    Left OM on exam. No improvement with Augmentin prescribed at urgent care. Will change to Levaquin. Continue probiotic while on antibiotics. Follow up in 2 weeks and prn.      Relevant Medications   levofloxacin (LEVAQUIN) 500 MG tablet       Return in about 2 weeks (around 09/16/2014) for Recheck.

## 2014-09-03 LAB — COMPREHENSIVE METABOLIC PANEL
ALBUMIN: 4.7 g/dL (ref 3.5–5.2)
ALT: 20 U/L (ref 0–53)
AST: 20 U/L (ref 0–37)
Alkaline Phosphatase: 69 U/L (ref 39–117)
BUN: 16 mg/dL (ref 6–23)
CALCIUM: 9.3 mg/dL (ref 8.4–10.5)
CHLORIDE: 103 meq/L (ref 96–112)
CO2: 28 mEq/L (ref 19–32)
Creatinine, Ser: 1.09 mg/dL (ref 0.40–1.50)
GFR: 72.68 mL/min (ref >60.00)
GLUCOSE: 88 mg/dL (ref 70–99)
POTASSIUM: 4.1 meq/L (ref 3.5–5.1)
SODIUM: 139 meq/L (ref 135–145)
Total Bilirubin: 0.5 mg/dL (ref 0.2–1.2)
Total Protein: 6.7 g/dL (ref 6.0–8.3)

## 2014-09-03 LAB — CBC
HCT: 44.6 % (ref 39.0–52.0)
HEMOGLOBIN: 15.5 g/dL (ref 13.0–17.0)
MCHC: 34.7 g/dL (ref 30.0–36.0)
MCV: 86.9 fl (ref 78.0–100.0)
Platelets: 209 10*3/uL (ref 150.0–400.0)
RBC: 5.14 Mil/uL (ref 4.22–5.81)
RDW: 13.4 % (ref 11.5–15.5)
WBC: 8.9 10*3/uL (ref 4.0–10.5)

## 2014-09-03 LAB — VITAMIN B12: Vitamin B-12: 471 pg/mL (ref 211–911)

## 2014-09-03 LAB — TSH: TSH: 3.55 u[IU]/mL (ref 0.35–4.50)

## 2014-09-17 ENCOUNTER — Ambulatory Visit (INDEPENDENT_AMBULATORY_CARE_PROVIDER_SITE_OTHER): Payer: BC Managed Care – PPO | Admitting: Internal Medicine

## 2014-09-17 ENCOUNTER — Encounter: Payer: Self-pay | Admitting: Internal Medicine

## 2014-09-17 VITALS — BP 132/70 | HR 61 | Temp 98.2°F | Wt 196.0 lb

## 2014-09-17 DIAGNOSIS — F411 Generalized anxiety disorder: Secondary | ICD-10-CM

## 2014-09-17 DIAGNOSIS — G8929 Other chronic pain: Secondary | ICD-10-CM | POA: Diagnosis not present

## 2014-09-17 DIAGNOSIS — F32A Depression, unspecified: Secondary | ICD-10-CM

## 2014-09-17 DIAGNOSIS — F329 Major depressive disorder, single episode, unspecified: Secondary | ICD-10-CM

## 2014-09-17 MED ORDER — PAROXETINE HCL 40 MG PO TABS: 40.0000 mg | ORAL_TABLET | Freq: Every day | ORAL | Status: DC

## 2014-09-17 MED ORDER — GABAPENTIN 100 MG PO CAPS: 100.0000 mg | ORAL_CAPSULE | Freq: Three times a day (TID) | ORAL | Status: DC

## 2014-09-17 NOTE — Assessment & Plan Note (Signed)
Symptoms poorly controlled. Will contract for safety. Will stop Sertraline and change to Paxil, as this worked better for him in the past. Follow up in 2 weeks.

## 2014-09-17 NOTE — Progress Notes (Signed)
Pre visit review using our clinic review tool, if applicable. No additional management support is needed unless otherwise documented below in the visit note. 

## 2014-09-17 NOTE — Assessment & Plan Note (Signed)
Symptoms poorly controlled with Sertraline. Will change back to Paxil which worked well for him in the past. We discussed some potential side effects he may see during change in medication. He will take 1/2 dose of Sertraline tonight. Follow up in 2 weeks and prn.

## 2014-09-17 NOTE — Progress Notes (Signed)
Subjective:    Patient ID: Bryan Hale, male    DOB: 08-30-1950, 64 y.o.   MRN: [id number]  HPI  64YO male presents for follow up.  Depression/axniety - Recently seen with worsening depression on 8/4. Started on Cymbalta. Unable to tolerate this medication. Had nausea and dizziness. Stopped Cymbalta. Continues on Sertraline. Would like to go back to Paxil which worked well for him in the past. Currently has daily anxiety that affects his ADLs as well as frequent depressed mood. No suicidal ideation. No trouble sleeping. Continues to work full time. Would also like to start Neurontin to help with chronic pain. Was on this in the past with some improvement in pain and anxiety.     Past Medical History  Diagnosis Date  . Chronic back pain   . Skin cancer   . Anxiety   . Gout   . Vitamin D deficiency   . Hyperlipidemia   . Chronic pain   . Esophageal reflux   . Sleep apnea, obstructive     starting BiPAP  . Sleep apnea    Family History  Problem Relation Age of Onset  . Hypertension Mother   . Colon cancer Neg Hx    Past Surgical History  Procedure Laterality Date  . Knee surgery  2008    LEFT, damage to nerves, no feeling in left foot except pain/burning, Dr Quillian Quince [facility]  . Colonoscopy     Social History   Social History  . Marital Status: Married    Spouse Name: N/A  . Number of Children: N/A  . Years of Education: N/A   Social History Main Topics  . Smoking status: Former Smoker -- 0.25 packs/day    Types: Cigarettes    Quit date: 08/21/2013  . Smokeless tobacco: Never Used  . Alcohol Use: 0.0 oz/week    0 Standard drinks or equivalent per week     Comment: occasionally  . Drug Use: No  . Sexual Activity: Not on file   Other Topics Concern  . Not on file   Social History Narrative    Review of Systems  Constitutional: Negative for fever, chills, activity change, appetite change, fatigue and unexpected weight change.  Eyes:  Negative for visual disturbance.  Respiratory: Negative for cough and shortness of breath.   Cardiovascular: Negative for chest pain, palpitations and leg swelling.  Gastrointestinal: Negative for abdominal pain and abdominal distention.  Genitourinary: Negative for dysuria, urgency and difficulty urinating.  Musculoskeletal: Positive for myalgias and arthralgias. Negative for gait problem.  Skin: Negative for color change and rash.  Hematological: Negative for adenopathy.  Psychiatric/Behavioral: Positive for dysphoric mood. Negative for suicidal ideas, sleep disturbance and decreased concentration. The patient is nervous/anxious.        Objective:    BP 132/70 mmHg  Pulse 61  Temp(Src) 98.2 F (36.8 C) (Oral)  Wt 196 lb (88.905 kg)  SpO2 94% Physical Exam  Constitutional: He is oriented to person, place, and time. He appears well-developed and well-nourished. No distress.  HENT:  Head: Normocephalic and atraumatic.  Right Ear: External ear normal. A middle ear effusion is present.  Left Ear: External ear normal. A middle ear effusion is present.  Nose: Nose normal.  Mouth/Throat: Oropharynx is clear and moist. No oropharyngeal exudate.  Eyes: Conjunctivae and EOM are normal. Pupils are equal, round, and reactive to light. Right eye exhibits no discharge. Left eye exhibits no discharge. No scleral icterus.  Neck: Normal range of  motion. Neck supple. No tracheal deviation present. No thyromegaly present.  Cardiovascular: Normal rate, regular rhythm and normal heart sounds.  Exam reveals no gallop and no friction rub.   No murmur heard. Pulmonary/Chest: Effort normal and breath sounds normal. No accessory muscle usage. No tachypnea. No respiratory distress. He has no decreased breath sounds. He has no wheezes. He has no rhonchi. He has no rales. He exhibits no tenderness.  Musculoskeletal: Normal range of motion. He exhibits no edema.  Lymphadenopathy:    He has no cervical  adenopathy.  Neurological: He is alert and oriented to person, place, and time. No cranial nerve deficit. Coordination normal.  Skin: Skin is warm and dry. No rash noted. He is not diaphoretic. No erythema. No pallor.  Psychiatric: His speech is normal and behavior is normal. Judgment and thought content normal. His mood appears anxious. He exhibits a depressed mood. He expresses no suicidal ideation.          Assessment & Plan:   Problem List Items Addressed This Visit      Unprioritized   Chronic pain    Chronic ankle pain after previous injury. Will add back Neurontin as this worked well for him in the past. Follow up 2 weeks and prn.      Relevant Medications   PARoxetine (PAXIL) 40 MG tablet   gabapentin (NEURONTIN) 100 MG capsule   Depression - Primary    Symptoms poorly controlled. Will contract for safety. Will stop Sertraline and change to Paxil, as this worked better for him in the past. Follow up in 2 weeks.      Relevant Medications   PARoxetine (PAXIL) 40 MG tablet   Generalized anxiety disorder    Symptoms poorly controlled with Sertraline. Will change back to Paxil which worked well for him in the past. We discussed some potential side effects he may see during change in medication. He will take 1/2 dose of Sertraline tonight. Follow up in 2 weeks and prn.          Return in about 2 weeks (around 10/01/2014) for Recheck.

## 2014-09-17 NOTE — Patient Instructions (Signed)
Stop Sertraline.  Start Paxil 40mg  daily.  Start Neurontin 100mg  three times daily.

## 2014-09-17 NOTE — Assessment & Plan Note (Signed)
Chronic ankle pain after previous injury. Will add back Neurontin as this worked well for him in the past. Follow up 2 weeks and prn.

## 2014-10-11 ENCOUNTER — Ambulatory Visit (INDEPENDENT_AMBULATORY_CARE_PROVIDER_SITE_OTHER): Payer: BC Managed Care – PPO | Admitting: Internal Medicine

## 2014-10-11 ENCOUNTER — Encounter: Payer: Self-pay | Admitting: Internal Medicine

## 2014-10-11 VITALS — BP 146/78 | HR 56 | Temp 98.1°F | Ht 69.5 in | Wt 197.1 lb

## 2014-10-11 DIAGNOSIS — F32A Depression, unspecified: Secondary | ICD-10-CM

## 2014-10-11 DIAGNOSIS — F329 Major depressive disorder, single episode, unspecified: Secondary | ICD-10-CM | POA: Diagnosis not present

## 2014-10-11 DIAGNOSIS — F411 Generalized anxiety disorder: Secondary | ICD-10-CM | POA: Diagnosis not present

## 2014-10-11 MED ORDER — GABAPENTIN 100 MG PO CAPS: 100.0000 mg | ORAL_CAPSULE | Freq: Three times a day (TID) | ORAL | Status: DC

## 2014-10-11 MED ORDER — DULOXETINE HCL 30 MG PO CPEP: 30.0000 mg | ORAL_CAPSULE | Freq: Two times a day (BID) | ORAL | Status: DC

## 2014-10-11 NOTE — Progress Notes (Signed)
Pre visit review using our clinic review tool, if applicable. No additional management support is needed unless otherwise documented below in the visit note. 

## 2014-10-11 NOTE — Assessment & Plan Note (Signed)
Symptoms improved with Cymbalta. Will continue to monitor.

## 2014-10-11 NOTE — Progress Notes (Signed)
Subjective:    Patient ID: Bryan Hale, male    DOB: 01-20-1951, 64 y.o.   MRN: [id number]  HPI  64YO male presents for follow up.  Last seen 8/19. Started back on Paxil and Neurontin.  Initially, no improvement with Paxil. Stopped Paxil. Decided to try Cymbalta again. Started back on Cymbalta 30mg  daily, then increased to bid. Now symptoms of anxiety much improved. Difficult time for him. Wife diagnosed with breast cancer.  BP Readings from Last 3 Encounters:  10/11/14 146/78  09/17/14 132/70  09/02/14 151/78     Past Medical History  Diagnosis Date  . Chronic back pain   . Skin cancer   . Anxiety   . Gout   . Vitamin D deficiency   . Hyperlipidemia   . Chronic pain   . Esophageal reflux   . Sleep apnea, obstructive     starting BiPAP  . Sleep apnea    Family History  Problem Relation Age of Onset  . Hypertension Mother   . Colon cancer Neg Hx    Past Surgical History  Procedure Laterality Date  . Knee surgery  2008    LEFT, damage to nerves, no feeling in left foot except pain/burning, Dr Quillian Quince [facility]  . Colonoscopy     Social History   Social History  . Marital Status: Married    Spouse Name: N/A  . Number of Children: N/A  . Years of Education: N/A   Social History Main Topics  . Smoking status: Former Smoker -- 0.25 packs/day    Types: Cigarettes    Quit date: 08/21/2013  . Smokeless tobacco: Never Used  . Alcohol Use: 0.0 oz/week    0 Standard drinks or equivalent per week     Comment: occasionally  . Drug Use: No  . Sexual Activity: Not Asked   Other Topics Concern  . None   Social History Narrative    Review of Systems  Constitutional: Negative for fever, chills, activity change, appetite change, fatigue and unexpected weight change.  Eyes: Negative for visual disturbance.  Respiratory: Negative for cough and shortness of breath.   Cardiovascular: Negative for chest pain, palpitations and leg swelling.    Gastrointestinal: Negative for abdominal pain, diarrhea, constipation and abdominal distention.  Genitourinary: Negative for dysuria, urgency and difficulty urinating.  Musculoskeletal: Negative for arthralgias and gait problem.  Skin: Negative for color change and rash.  Hematological: Negative for adenopathy.  Psychiatric/Behavioral: Positive for behavioral problems, dysphoric mood and agitation. Negative for suicidal ideas, sleep disturbance and decreased concentration. The patient is nervous/anxious.        Objective:    BP 146/78 mmHg  Pulse 56  Temp(Src) 98.1 F (36.7 C) (Oral)  Ht 5' 9.5" (1.765 m)  Wt 197 lb 2 oz (89.415 kg)  BMI 28.70 kg/m2  SpO2 94% Physical Exam  Constitutional: He is oriented to person, place, and time. He appears well-developed and well-nourished. No distress.  HENT:  Head: Normocephalic and atraumatic.  Right Ear: External ear normal.  Left Ear: External ear normal.  Nose: Nose normal.  Mouth/Throat: Oropharynx is clear and moist. No oropharyngeal exudate.  Eyes: Conjunctivae and EOM are normal. Pupils are equal, round, and reactive to light. Right eye exhibits no discharge. Left eye exhibits no discharge. No scleral icterus.  Neck: Normal range of motion. Neck supple. No tracheal deviation present. No thyromegaly present.  Cardiovascular: Normal rate, regular rhythm and normal heart sounds.  Exam reveals no gallop and  no friction rub.   No murmur heard. Pulmonary/Chest: Effort normal and breath sounds normal. No accessory muscle usage. No tachypnea. No respiratory distress. He has no decreased breath sounds. He has no wheezes. He has no rhonchi. He has no rales. He exhibits no tenderness.  Musculoskeletal: Normal range of motion. He exhibits no edema.  Lymphadenopathy:    He has no cervical adenopathy.  Neurological: He is alert and oriented to person, place, and time. No cranial nerve deficit. Coordination normal.  Skin: Skin is warm and dry. No  rash noted. He is not diaphoretic. No erythema. No pallor.  Psychiatric: He has a normal mood and affect. His behavior is normal. Judgment and thought content normal. His mood appears not anxious. He does not exhibit a depressed mood. He expresses no suicidal ideation.          Assessment & Plan:   Problem List Items Addressed This Visit      Unprioritized   Depression    Symptoms improved with Cymbalta. Will continue to monitor.      Relevant Medications   DULoxetine (CYMBALTA) 30 MG capsule   Generalized anxiety disorder - Primary    Symptoms of anxiety and panic are improved after recent change back to Cymbalta. Will continue. Follow up in 6 months and prn.          Return in about 6 months (around 04/10/2015) for Recheck.

## 2014-10-11 NOTE — Patient Instructions (Signed)
Follow up in 6 months or sooner as needed.

## 2014-10-11 NOTE — Assessment & Plan Note (Signed)
Symptoms of anxiety and panic are improved after recent change back to Cymbalta. Will continue. Follow up in 6 months and prn.

## 2015-04-11 ENCOUNTER — Ambulatory Visit: Payer: BC Managed Care – PPO | Admitting: Internal Medicine

## 2015-06-29 ENCOUNTER — Telehealth: Payer: Self-pay | Admitting: *Deleted

## 2015-06-29 MED ORDER — DULOXETINE HCL 30 MG PO CPEP: 30.0000 mg | ORAL_CAPSULE | Freq: Two times a day (BID) | ORAL | Status: DC

## 2015-06-29 NOTE — Telephone Encounter (Signed)
Refill sent.

## 2015-06-29 NOTE — Telephone Encounter (Signed)
[facility] [address] [place] [phone] fax  Refill request: DULoxetine (CYMBALTA) 30 MG capsule  Last dispensed: 05/16/15 Last office visit: 10/11/14 No future visits scheduled  Okay to refill?

## 2015-06-29 NOTE — Telephone Encounter (Signed)
Fine to refill x 1 month. Needs follow up

## 2015-08-08 ENCOUNTER — Other Ambulatory Visit: Payer: Self-pay

## 2015-08-08 MED ORDER — DULOXETINE HCL 30 MG PO CPEP: 30.0000 mg | ORAL_CAPSULE | Freq: Two times a day (BID) | ORAL | Status: DC

## 2015-09-20 ENCOUNTER — Other Ambulatory Visit: Payer: Self-pay

## 2015-09-20 MED ORDER — DULOXETINE HCL 30 MG PO CPEP: 30.0000 mg | ORAL_CAPSULE | Freq: Two times a day (BID) | ORAL | 0 refills | Status: DC

## 2015-09-20 NOTE — Telephone Encounter (Signed)
Please advise refill, needs follow up. thanks

## 2015-11-10 ENCOUNTER — Ambulatory Visit: Payer: BC Managed Care – PPO | Admitting: Family Medicine

## 2015-11-14 ENCOUNTER — Other Ambulatory Visit: Payer: Self-pay | Admitting: Family Medicine

## 2015-11-14 NOTE — Telephone Encounter (Signed)
refilled 09/05/20/17. Last seen 09/2014. Please advise?

## 2015-11-24 ENCOUNTER — Ambulatory Visit (INDEPENDENT_AMBULATORY_CARE_PROVIDER_SITE_OTHER): Payer: BC Managed Care – PPO

## 2015-11-24 ENCOUNTER — Ambulatory Visit (INDEPENDENT_AMBULATORY_CARE_PROVIDER_SITE_OTHER): Payer: BC Managed Care – PPO | Admitting: Family Medicine

## 2015-11-24 ENCOUNTER — Encounter: Payer: Self-pay | Admitting: Family Medicine

## 2015-11-24 VITALS — BP 172/92 | HR 71 | Temp 98.4°F | Wt 197.8 lb

## 2015-11-24 DIAGNOSIS — M25562 Pain in left knee: Secondary | ICD-10-CM | POA: Diagnosis not present

## 2015-11-24 DIAGNOSIS — G8929 Other chronic pain: Secondary | ICD-10-CM | POA: Diagnosis not present

## 2015-11-24 DIAGNOSIS — R5382 Chronic fatigue, unspecified: Secondary | ICD-10-CM

## 2015-11-24 DIAGNOSIS — M25561 Pain in right knee: Secondary | ICD-10-CM | POA: Diagnosis not present

## 2015-11-24 DIAGNOSIS — Z125 Encounter for screening for malignant neoplasm of prostate: Secondary | ICD-10-CM

## 2015-11-24 DIAGNOSIS — R030 Elevated blood-pressure reading, without diagnosis of hypertension: Secondary | ICD-10-CM

## 2015-11-24 DIAGNOSIS — F411 Generalized anxiety disorder: Secondary | ICD-10-CM

## 2015-11-24 DIAGNOSIS — Z13220 Encounter for screening for lipoid disorders: Secondary | ICD-10-CM

## 2015-11-24 DIAGNOSIS — M1A00X0 Idiopathic chronic gout, unspecified site, without tophus (tophi): Secondary | ICD-10-CM | POA: Diagnosis not present

## 2015-11-24 NOTE — Patient Instructions (Signed)
Consider switching the cymbalta.  We will call regarding your labs.  Follow up in 2 weeks  Take care  Dr. Lacinda Axon

## 2015-11-24 NOTE — Progress Notes (Signed)
Pre visit review using our clinic review tool, if applicable. No additional management support is needed unless otherwise documented below in the visit note. 

## 2015-11-24 NOTE — Progress Notes (Signed)
Subjective:  Patient ID: Bryan Hale, male    DOB: [birth year]  Age: 65 y.o. MRN: [id number]  CC: Chronic pain, Anxiety, Fatigue, Gout  HPI:  65 year old male OSA, Gout, GAD/Depression, Chronic pain, Chronic fatigue presents with the above issues/complaints.  Chronic pain  Chronic problem.  Worsening over the past year.  He reports the pain affects his bilateral knees, ankles, and feet. It is particularly worse of the left foot (he states that he developed severe neuropathy following surgery).   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  He is unsure if anything can be done.  He does state that he's now having bilateral knee pain which is new.  Exacerbated by activity. No known relieving factors.  Gout  Patient reports a history of gout.  He states that he had an attack approximately 2 weeks ago. He states that his left knee and ankle were swollen, red, warm, and very tender.  It slowly improved.  He currently takes indomethacin as needed.  He would like to discuss this today.  Anxiety  Patient reports an ongoing issue with anxiety.  He states that his anxiety is increasing.  He states that he worries about everything.  He is currently on Cymbalta.  He has noticed little improvement with this.  He would also like to discuss treatment options today.  Fatigue  The EMR, patient has chronic fatigue.  He states that he continues to be fatigued and lethargic.  He is unsure of the etiology of his symptoms. I suspect that this is multifactorial with a significant component of depression and anxiety.  Social Hx   Social History   Social History  . Marital status: Married    Spouse name: N/A  . Number of children: N/A  . Years of education: N/A   Social History Main Topics  . Smoking status: Former Smoker    Packs/day: 0.25    Types: Cigarettes    Quit date: 08/21/2013  . Smokeless tobacco: Never Used  . Alcohol use 0.0 oz/week     Comment:  occasionally  . Drug use: No  . Sexual activity: Not Asked   Other Topics Concern  . None   Social History Narrative  . None    Review of Systems  Constitutional: Positive for fatigue.  Musculoskeletal: Positive for arthralgias.  Psychiatric/Behavioral: The patient is nervous/anxious.    Objective:  BP (!) 172/92 (BP Location: Right Arm, Patient Position: Sitting, Cuff Size: Normal)   Pulse 71   Temp 98.4 F (36.9 C) (Oral)   Wt 197 lb 12 oz (89.7 kg)   SpO2 94%   BMI 28.78 kg/m   BP/Weight 11/24/2015 10/11/2014 0/93/8182  Systolic BP 993 716 967  Diastolic BP 92 78 70  Wt. (Lbs) 197.75 197.13 196  BMI 28.78 28.7 28.12   Physical Exam  Constitutional: He is oriented to person, place, and time. He appears well-developed. No distress.  Cardiovascular: Normal rate and regular rhythm.   Pulmonary/Chest: Effort normal. He has no wheezes. He has no rales.  Musculoskeletal:  Bilateral knees with crepitus with extension. No effusion, warmth noted.  Neurological: He is alert and oriented to person, place, and time.  Psychiatric:  Flat affect, depressed mood.  Vitals reviewed.  Lab Results  Component Value Date   WBC 8.9 09/02/2014   HGB 15.5 09/02/2014   HCT 44.6 09/02/2014   PLT 209.0 09/02/2014   GLUCOSE 88 09/02/2014   ALT 20 09/02/2014   AST 20 09/02/2014  NA 139 09/02/2014   K 4.1 09/02/2014   CL 103 09/02/2014   CREATININE 1.09 09/02/2014   BUN 16 09/02/2014   CO2 28 09/02/2014   TSH 3.55 09/02/2014    Assessment & Plan:   Problem List Items Addressed This Visit    Gout    ? Recent attack. Obtaining Uric acid.       Relevant Orders   Uric acid   Sed Rate (ESR)   Generalized anxiety disorder    Worsening per report. Discussed changing therapy. Patient elected to stay on Cymbalta.      Relevant Orders   Lipid Profile   Chronic pain of both knees - Primary    Worsening pain, particularly of the knees. Suspect OA, possible underlying  rheumatologic disease as history suggests synovitis (could be gouty arthritis). Obtaining xray and labs.      Relevant Orders   DG Knee Bilateral Standing AP   Sed Rate (ESR)   Chronic fatigue    Continues to be a problem. Suspect from chronic pain, depression/anxiety. Obtaining labs.       Relevant Orders   CBC   Comp Met (CMET)   Testosterone Total,Free,Bio, Males   TSH   B12    Other Visit Diagnoses    Prostate cancer screening       Relevant Orders   PSA   Screening, lipid       Elevated BP without diagnosis of hypertension       Relevant Orders   HgB A1c     Follow-up: Return in about 2 weeks (around 12/08/2015).  [place]

## 2015-11-25 DIAGNOSIS — M25561 Pain in right knee: Principal | ICD-10-CM

## 2015-11-25 DIAGNOSIS — M25562 Pain in left knee: Principal | ICD-10-CM

## 2015-11-25 DIAGNOSIS — G8929 Other chronic pain: Secondary | ICD-10-CM | POA: Insufficient documentation

## 2015-11-25 NOTE — Assessment & Plan Note (Signed)
?   Recent attack. Obtaining Uric acid.

## 2015-11-25 NOTE — Assessment & Plan Note (Signed)
Worsening per report. Discussed changing therapy. Patient elected to stay on Cymbalta.

## 2015-11-25 NOTE — Assessment & Plan Note (Signed)
Worsening pain, particularly of the knees. Suspect OA, possible underlying rheumatologic disease as history suggests synovitis (could be gouty arthritis). Obtaining xray and labs.

## 2015-11-25 NOTE — Assessment & Plan Note (Signed)
Continues to be a problem. Suspect from chronic pain, depression/anxiety. Obtaining labs.

## 2015-11-29 ENCOUNTER — Other Ambulatory Visit (INDEPENDENT_AMBULATORY_CARE_PROVIDER_SITE_OTHER): Payer: BC Managed Care – PPO

## 2015-11-29 DIAGNOSIS — M25562 Pain in left knee: Secondary | ICD-10-CM

## 2015-11-29 DIAGNOSIS — G8929 Other chronic pain: Secondary | ICD-10-CM

## 2015-11-29 DIAGNOSIS — F411 Generalized anxiety disorder: Secondary | ICD-10-CM | POA: Diagnosis not present

## 2015-11-29 DIAGNOSIS — Z125 Encounter for screening for malignant neoplasm of prostate: Secondary | ICD-10-CM

## 2015-11-29 DIAGNOSIS — M25561 Pain in right knee: Secondary | ICD-10-CM | POA: Diagnosis not present

## 2015-11-29 DIAGNOSIS — R7989 Other specified abnormal findings of blood chemistry: Secondary | ICD-10-CM

## 2015-11-29 DIAGNOSIS — M1A00X0 Idiopathic chronic gout, unspecified site, without tophus (tophi): Secondary | ICD-10-CM | POA: Diagnosis not present

## 2015-11-29 DIAGNOSIS — R5382 Chronic fatigue, unspecified: Secondary | ICD-10-CM

## 2015-11-29 DIAGNOSIS — R030 Elevated blood-pressure reading, without diagnosis of hypertension: Secondary | ICD-10-CM | POA: Diagnosis not present

## 2015-11-29 LAB — LIPID PANEL
Cholesterol: 216 mg/dL — ABNORMAL HIGH (ref 0–200)
HDL: 30.4 mg/dL — ABNORMAL LOW (ref >39.00)
NonHDL: 185.38
Total CHOL/HDL Ratio: 7
Triglycerides: 303 mg/dL — ABNORMAL HIGH (ref 0.0–149.0)
VLDL: 60.6 mg/dL — ABNORMAL HIGH (ref 0.0–40.0)

## 2015-11-29 LAB — COMPREHENSIVE METABOLIC PANEL
ALBUMIN: 4.5 g/dL (ref 3.5–5.2)
ALK PHOS: 71 U/L (ref 39–117)
ALT: 22 U/L (ref 0–53)
AST: 17 U/L (ref 0–37)
BILIRUBIN TOTAL: 0.6 mg/dL (ref 0.2–1.2)
BUN: 12 mg/dL (ref 6–23)
CO2: 28 mEq/L (ref 19–32)
CREATININE: 0.95 mg/dL (ref 0.40–1.50)
Calcium: 9.8 mg/dL (ref 8.4–10.5)
Chloride: 106 mEq/L (ref 96–112)
GFR: 84.84 mL/min (ref >60.00)
Glucose, Bld: 122 mg/dL — ABNORMAL HIGH (ref 70–99)
POTASSIUM: 4.6 meq/L (ref 3.5–5.1)
SODIUM: 141 meq/L (ref 135–145)
TOTAL PROTEIN: 6.8 g/dL (ref 6.0–8.3)

## 2015-11-29 LAB — LDL CHOLESTEROL, DIRECT: Direct LDL: 108 mg/dL

## 2015-11-29 LAB — CBC
HEMATOCRIT: 47.1 % (ref 39.0–52.0)
Hemoglobin: 16.2 g/dL (ref 13.0–17.0)
MCHC: 34.4 g/dL (ref 30.0–36.0)
MCV: 84.9 fl (ref 78.0–100.0)
Platelets: 253 10*3/uL (ref 150.0–400.0)
RBC: 5.55 Mil/uL (ref 4.22–5.81)
RDW: 13.6 % (ref 11.5–15.5)
WBC: 6.5 10*3/uL (ref 4.0–10.5)

## 2015-11-29 LAB — SEDIMENTATION RATE: Sed Rate: 6 mm/hr (ref 0–20)

## 2015-11-29 LAB — VITAMIN B12: Vitamin B-12: 375 pg/mL (ref 211–911)

## 2015-11-29 LAB — PSA: PSA: 1.74 ng/mL (ref 0.10–4.00)

## 2015-11-29 LAB — HEMOGLOBIN A1C: HEMOGLOBIN A1C: 5.9 % (ref 4.6–6.5)

## 2015-11-29 LAB — URIC ACID: Uric Acid, Serum: 8.2 mg/dL — ABNORMAL HIGH (ref 4.0–7.8)

## 2015-11-29 LAB — TSH: TSH: 2.69 u[IU]/mL (ref 0.35–4.50)

## 2015-11-30 LAB — TESTOSTERONE TOTAL,FREE,BIO, MALES
ALBUMIN: 4.6 g/dL (ref 3.6–5.1)
Sex Hormone Binding: 15 nmol/L — ABNORMAL LOW (ref 22–77)
TESTOSTERONE: 195 ng/dL — AB (ref 250–827)

## 2015-12-05 ENCOUNTER — Telehealth: Payer: Self-pay | Admitting: *Deleted

## 2015-12-05 NOTE — Telephone Encounter (Signed)
Patient requested lab results Pt contact [phone]

## 2015-12-08 ENCOUNTER — Ambulatory Visit (INDEPENDENT_AMBULATORY_CARE_PROVIDER_SITE_OTHER): Payer: BC Managed Care – PPO | Admitting: Family Medicine

## 2015-12-08 ENCOUNTER — Encounter: Payer: Self-pay | Admitting: Family Medicine

## 2015-12-08 VITALS — BP 174/104 | HR 77 | Temp 98.5°F | Resp 16 | Wt 199.5 lb

## 2015-12-08 DIAGNOSIS — R5382 Chronic fatigue, unspecified: Secondary | ICD-10-CM

## 2015-12-08 DIAGNOSIS — M1A00X0 Idiopathic chronic gout, unspecified site, without tophus (tophi): Secondary | ICD-10-CM

## 2015-12-08 DIAGNOSIS — I10 Essential (primary) hypertension: Secondary | ICD-10-CM

## 2015-12-08 DIAGNOSIS — E782 Mixed hyperlipidemia: Secondary | ICD-10-CM | POA: Diagnosis not present

## 2015-12-08 MED ORDER — LOSARTAN POTASSIUM 50 MG PO TABS: 50.0000 mg | ORAL_TABLET | Freq: Every day | ORAL | 3 refills | Status: AC

## 2015-12-08 NOTE — Progress Notes (Signed)
Pre visit review using our clinic review tool, if applicable. No additional management support is needed unless otherwise documented below in the visit note. 

## 2015-12-08 NOTE — Patient Instructions (Signed)
Take the losartan as prescribed.  Lifestyle changes and exercise.  Follow up in 2 weeks to 1 month.  Take care  Dr. Lacinda Axon

## 2015-12-09 DIAGNOSIS — I10 Essential (primary) hypertension: Secondary | ICD-10-CM | POA: Insufficient documentation

## 2015-12-09 DIAGNOSIS — R7303 Prediabetes: Secondary | ICD-10-CM | POA: Insufficient documentation

## 2015-12-09 DIAGNOSIS — E785 Hyperlipidemia, unspecified: Secondary | ICD-10-CM | POA: Insufficient documentation

## 2015-12-09 NOTE — Assessment & Plan Note (Signed)
Multifactorial. Patient is not interested and treatment of low T at this time. After lengthy discussion, patient is going to make lifestyle changes. He is going to change his diet and exercise regularly.

## 2015-12-09 NOTE — Progress Notes (Signed)
Subjective:  Patient ID: Bryan Hale, male    DOB: 02-Mar-1950  Age: 65 y.o. MRN: [id number]  CC: 2 week follow up  HPI:  65 year old male with chronic pain, depression, gout, chronic fatigue presents for follow-up.  Elevated BP  The patient's blood pressure was elevated at last visit.  His pressure continues to be elevated.  He has had multiple elevated readings in the past.  He has never been on treatment.  We'll discuss today.  Gout  At our last visit, uric acid level returned elevated at 8.2.  He has been having frequent flares recently.  He has been using colchicine or indomethacin for flares.  Will discuss uric acid lowering therapies today.  Chronic fatigue  Patient continues to have chronic fatigue.  This is likely multifactorial in nature.  His testosterone was low when tested.  Will discuss this and other etiologies as well as treatment options today.  HLD  Uncontrolled.  No treatment at this time.  Will discuss today.  Social Hx   Social History   Social History  . Marital status: Married    Spouse name: N/A  . Number of children: N/A  . Years of education: N/A   Social History Main Topics  . Smoking status: Former Smoker    Packs/day: 0.25    Types: Cigarettes    Quit date: 08/21/2013  . Smokeless tobacco: Never Used  . Alcohol use 0.0 oz/week     Comment: occasionally  . Drug use: No  . Sexual activity: Not Asked   Other Topics Concern  . None   Social History Narrative  . None    Review of Systems  Constitutional: Positive for fatigue.  Musculoskeletal: Positive for arthralgias.   Objective:  BP (!) 174/104 (BP Location: Right Arm, Patient Position: Sitting, Cuff Size: Normal)   Pulse 77   Temp 98.5 F (36.9 C) (Oral)   Resp 16   Wt 199 lb 8 oz (90.5 kg)   SpO2 96%   BMI 29.04 kg/m   BP/Weight 12/08/2015 11/24/2015 [id number]  Systolic BP [id number]  Diastolic BP 123456 92 78  Wt. (Lbs) 199.5 197.75 197.13    BMI 29.04 28.78 28.7   Physical Exam  Constitutional: He is oriented to person, place, and time. He appears well-developed. No distress.  Cardiovascular: Normal rate and regular rhythm.   Pulmonary/Chest: Effort normal and breath sounds normal.  Neurological: He is alert and oriented to person, place, and time.  Psychiatric:  Flat affect, depressed mood.  Vitals reviewed.  Lab Results  Component Value Date   WBC 6.5 11/29/2015   HGB 16.2 11/29/2015   HCT 47.1 11/29/2015   PLT 253.0 11/29/2015   GLUCOSE 122 (H) 11/29/2015   CHOL 216 (H) 11/29/2015   TRIG 303.0 (H) 11/29/2015   HDL 30.40 (L) 11/29/2015   LDLDIRECT 108.0 11/29/2015   ALT 22 11/29/2015   AST 17 11/29/2015   NA 141 11/29/2015   K 4.6 11/29/2015   CL 106 11/29/2015   CREATININE 0.95 11/29/2015   BUN 12 11/29/2015   CO2 28 11/29/2015   TSH 2.69 11/29/2015   PSA 1.74 11/29/2015   HGBA1C 5.9 11/29/2015    Assessment & Plan:   Problem List Items Addressed This Visit    Hyperlipidemia    New problem. Declines treatment at this time. Elected for lifestyle change.      Relevant Medications   losartan (COZAAR) 50 MG tablet   Gout    Uncontrolled. Patient  declined medication at this time. He is going to proceed with lifestyle changes. If continues to have gout flares will will start allopurinol.      Essential hypertension    New problem, new diagnosis. Starting treatment with losartan.      Relevant Medications   losartan (COZAAR) 50 MG tablet   Chronic fatigue    Multifactorial. Patient is not interested and treatment of low T at this time. After lengthy discussion, patient is going to make lifestyle changes. He is going to change his diet and exercise regularly.         Meds ordered this encounter  Medications  . losartan (COZAAR) 50 MG tablet    Sig: Take 1 tablet (50 mg total) by mouth daily.    Dispense:  90 tablet    Refill:  3    Follow-up: 2 weeks to 1 month  [place]

## 2015-12-09 NOTE — Assessment & Plan Note (Signed)
Uncontrolled. Patient declined medication at this time. He is going to proceed with lifestyle changes. If continues to have gout flares will will start allopurinol.

## 2015-12-09 NOTE — Assessment & Plan Note (Signed)
New problem, new diagnosis. Starting treatment with losartan.

## 2015-12-09 NOTE — Assessment & Plan Note (Signed)
New problem. Declines treatment at this time. Elected for lifestyle change.

## 2015-12-13 ENCOUNTER — Telehealth: Payer: Self-pay

## 2015-12-13 NOTE — Telephone Encounter (Signed)
-----   Message from Coral Spikes, DO sent at 12/09/2015  7:34 AM EST ----- Need BMP in 7-10 days. Please schedule with patient.

## 2015-12-13 NOTE — Telephone Encounter (Signed)
Pt was called and a voicemail was left for him to callback and schedule labs in the next 3-4 days for a BMP. Please schedule these before 11/30 appt.

## 2015-12-19 ENCOUNTER — Other Ambulatory Visit: Payer: Self-pay | Admitting: Family Medicine

## 2015-12-19 ENCOUNTER — Telehealth: Payer: Self-pay

## 2015-12-19 DIAGNOSIS — I10 Essential (primary) hypertension: Secondary | ICD-10-CM

## 2015-12-19 NOTE — Telephone Encounter (Signed)
Pt coming for repeat labs 12/20/15. Need future order placed. Looks like re-check of bmp. Please place order. Thank you.

## 2015-12-20 ENCOUNTER — Other Ambulatory Visit (INDEPENDENT_AMBULATORY_CARE_PROVIDER_SITE_OTHER): Payer: BC Managed Care – PPO

## 2015-12-20 DIAGNOSIS — I10 Essential (primary) hypertension: Secondary | ICD-10-CM

## 2015-12-20 LAB — BASIC METABOLIC PANEL
BUN: 16 mg/dL (ref 6–23)
CALCIUM: 9.8 mg/dL (ref 8.4–10.5)
CHLORIDE: 101 meq/L (ref 96–112)
CO2: 29 mEq/L (ref 19–32)
CREATININE: 0.97 mg/dL (ref 0.40–1.50)
GFR: 82.81 mL/min (ref >60.00)
Glucose, Bld: 90 mg/dL (ref 70–99)
Potassium: 4.4 mEq/L (ref 3.5–5.1)
Sodium: 137 mEq/L (ref 135–145)

## 2015-12-26 ENCOUNTER — Other Ambulatory Visit: Payer: Self-pay | Admitting: Family Medicine

## 2015-12-29 ENCOUNTER — Encounter: Payer: Self-pay | Admitting: Family Medicine

## 2015-12-29 ENCOUNTER — Ambulatory Visit (INDEPENDENT_AMBULATORY_CARE_PROVIDER_SITE_OTHER): Payer: BC Managed Care – PPO | Admitting: Family Medicine

## 2015-12-29 VITALS — BP 137/75 | HR 60 | Temp 98.0°F | Resp 12 | Wt 192.1 lb

## 2015-12-29 DIAGNOSIS — M79671 Pain in right foot: Secondary | ICD-10-CM | POA: Diagnosis not present

## 2015-12-29 DIAGNOSIS — I10 Essential (primary) hypertension: Secondary | ICD-10-CM

## 2015-12-29 DIAGNOSIS — M79661 Pain in right lower leg: Secondary | ICD-10-CM | POA: Diagnosis not present

## 2015-12-29 MED ORDER — PREDNISONE 10 MG (48) PO TBPK: ORAL_TABLET | ORAL | 0 refills | Status: DC

## 2015-12-29 NOTE — Patient Instructions (Signed)
We will arrange the work up regarding the calf pain.  Take the prednisone as prescribed.  Take care  Dr. Lacinda Axon

## 2015-12-30 DIAGNOSIS — M79661 Pain in right lower leg: Secondary | ICD-10-CM | POA: Insufficient documentation

## 2015-12-30 DIAGNOSIS — M79671 Pain in right foot: Secondary | ICD-10-CM | POA: Insufficient documentation

## 2015-12-30 NOTE — Assessment & Plan Note (Signed)
New problem. Exam consistent with achilles tendinopathy/enthesopathy. Treating with pred taper. If no improvement will need SM referral and [place].

## 2015-12-30 NOTE — Progress Notes (Addendum)
Subjective:  Patient ID: Bryan Hale, male    DOB: 1950-02-26  Age: 65 y.o. MRN: [id number]  CC: Follow up HTN, Calf pain, Foot/ankle pain & swelling  HPI:  65 year old male presents for follow-up regarding his hypertension. He has an additional complaints today. See below.  HTN  Endorses compliance with valsartan.  He states his blood pressures are running from 135-145/90 at home.  Calf pain, right  Patient reports long-standing chronic calf pain of the right leg.  He states that it is severe with exertion. He states after a long walk he has severe pain. Once he stops the pain resolves.  This is been an ongoing issue.  Seems to be worse as of late.  He has a history of tobacco abuse, hyperlipidemia, and hypertension.  He states he mentioned this to his previous provider and no further workup was ever done.  Heel pain/ankle pain/swelling  Patient reports a 9 day history of severe right heel pain and associated right ankle pain/swelling.  Patient states that the only possible inciting factor was a recent brisk walk with his wife. He has not fallen or injured himself otherwise.  He reports that it was worse on Thanksgiving day. He reports associated swelling of the heel and ankle. He states that he "swoll up to the size of a grapefruit".  The swelling has improved around the ankle but he still has neck and swelling at the heel. Ankle pain seems to have resolved.  Patient does have a history of gout.  No known relieving factors.  Worse with ambulation.  Interferes with ambulation and with sleep due to the severity of his pain.  Social Hx   Social History   Social History  . Marital status: Married    Spouse name: N/A  . Number of children: N/A  . Years of education: N/A   Social History Main Topics  . Smoking status: Former Smoker    Packs/day: 0.25    Types: Cigarettes    Quit date: 08/21/2013  . Smokeless tobacco: Never Used  . Alcohol use 0.0  oz/week     Comment: occasionally  . Drug use: No  . Sexual activity: Not Asked   Other Topics Concern  . None   Social History Narrative  . None    Review of Systems  Constitutional: Negative.   Musculoskeletal: Positive for gait problem and joint swelling.   Objective:  BP 137/75 (BP Location: Left Arm, Patient Position: Sitting, Cuff Size: Normal)   Pulse 60   Temp 98 F (36.7 C) (Oral)   Resp 12   Wt 192 lb 2 oz (87.1 kg)   SpO2 98%   BMI 27.97 kg/m   BP/Weight 12/29/2015 12/08/2015 [id number]  Systolic BP 0000000 [id number]  Diastolic BP 75 123456 92  Wt. (Lbs) 192.13 199.5 197.75  BMI 27.97 29.04 28.78   Physical Exam  Constitutional: He is oriented to person, place, and time. He appears well-developed. No distress.  Pulmonary/Chest: Effort normal and breath sounds normal.  Musculoskeletal:  Right heel - posterior heel at the attachment site of the Achilles with swelling and mild warmth. Severely tender to palpation. Right calf - negative Homans sign. No swelling or edema. No erythema.  Neurological: He is alert and oriented to person, place, and time.  Psychiatric:  Flat affect.   Vitals reviewed.  Lab Results  Component Value Date   WBC 6.5 11/29/2015   HGB 16.2 11/29/2015   HCT 47.1 11/29/2015   PLT 253.0  11/29/2015   GLUCOSE 90 12/20/2015   CHOL 216 (H) 11/29/2015   TRIG 303.0 (H) 11/29/2015   HDL 30.40 (L) 11/29/2015   LDLDIRECT 108.0 11/29/2015   ALT 22 11/29/2015   AST 17 11/29/2015   NA 137 12/20/2015   K 4.4 12/20/2015   CL 101 12/20/2015   CREATININE 0.97 12/20/2015   BUN 16 12/20/2015   CO2 29 12/20/2015   TSH 2.69 11/29/2015   PSA 1.74 11/29/2015   HGBA1C 5.9 11/29/2015    Assessment & Plan:   Problem List Items Addressed This Visit    Right calf pain    New problem. History consistent with claudication. 1-2+ DP and PT pulses. Does have a history of tobacco abuse, HTN, HLD. Arranging for ABI's.      Relevant Orders   VAS [place] ABI  WITH/WO TBI   Pain of right heel    New problem. Exam consistent with achilles tendinopathy/enthesopathy. Treating with pred taper. If no improvement will need SM referral and [place].      Essential hypertension - Primary    Established problem, improving. Will continue current dosing of losartan and follow up in 1 month.         Meds ordered this encounter  Medications  . predniSONE (STERAPRED UNI-PAK 48 TAB) 10 MG (48) TBPK tablet    Sig: Per package instructions.    Dispense:  48 tablet    Refill:  0    Follow-up: Return in about 1 month (around 01/28/2016).  [place]

## 2015-12-30 NOTE — Assessment & Plan Note (Signed)
Established problem, improving. Will continue current dosing of losartan and follow up in 1 month.

## 2015-12-30 NOTE — Assessment & Plan Note (Signed)
New problem. History consistent with claudication. 1-2+ DP and PT pulses. Does have a history of tobacco abuse, HTN, HLD. Arranging for ABI's.

## 2015-12-30 NOTE — Addendum Note (Signed)
Addended by: Coral Spikes on: 12/30/2015 08:08 AM   Modules accepted: Orders

## 2016-02-03 ENCOUNTER — Encounter: Payer: Self-pay | Admitting: Family Medicine

## 2016-02-03 ENCOUNTER — Ambulatory Visit (INDEPENDENT_AMBULATORY_CARE_PROVIDER_SITE_OTHER): Payer: BC Managed Care – PPO | Admitting: Family Medicine

## 2016-02-03 DIAGNOSIS — B9789 Other viral agents as the cause of diseases classified elsewhere: Secondary | ICD-10-CM | POA: Diagnosis not present

## 2016-02-03 DIAGNOSIS — J988 Other specified respiratory disorders: Secondary | ICD-10-CM | POA: Diagnosis not present

## 2016-02-03 MED ORDER — HYDROCOD POLST-CPM POLST ER 10-8 MG/5ML PO SUER: 5.0000 mL | Freq: Two times a day (BID) | ORAL | 0 refills | Status: AC | PRN

## 2016-02-03 NOTE — Progress Notes (Signed)
Pre visit review using our clinic review tool, if applicable. No additional management support is needed unless otherwise documented below in the visit note. 

## 2016-02-03 NOTE — Progress Notes (Signed)
   Subjective:  Patient ID: Bryan Hale, male    DOB: 01-Oct-1950  Age: 66 y.o. MRN: [id number]  CC: Cough  HPI:  66 year old male presents with the above complaints.  This was supposed to be a follow-up visit but he has an acute issue.  Patient states that he's been sick since Monday. She's had severe cough. He's also had postnasal drip, sinus pressure and congestion. No fever or chills. Cough is worse at night. No known exacerbating or relieving factors. No other associated symptoms. No other complaints or concerns at this time.  Social Hx   Social History   Social History  . Marital status: Married    Spouse name: N/A  . Number of children: N/A  . Years of education: N/A   Social History Main Topics  . Smoking status: Former Smoker    Packs/day: 0.25    Types: Cigarettes    Quit date: 08/21/2013  . Smokeless tobacco: Never Used  . Alcohol use 0.0 oz/week     Comment: occasionally  . Drug use: No  . Sexual activity: Not Asked   Other Topics Concern  . None   Social History Narrative  . None   Review of Systems  Constitutional: Negative for fever.  HENT: Positive for congestion, postnasal drip and sinus pressure.   Respiratory: Positive for cough.    Objective:  BP (!) 149/94   Pulse 84   Temp 98.7 F (37.1 C) (Oral)   Resp 14   Wt 182 lb 6.4 oz (82.7 kg)   SpO2 99%   BMI 26.55 kg/m   BP/Weight 02/03/2016 12/29/2015 [id number]  Systolic BP 123456 0000000 [id number]  Diastolic BP 94 75 123456  Wt. (Lbs) 182.4 192.13 199.5  BMI 26.55 27.97 29.04   Physical Exam  Constitutional: He is oriented to person, place, and time. He appears well-developed. No distress.  HENT:  Mouth/Throat: Oropharynx is clear and moist.  Cardiovascular: Normal rate and regular rhythm.   Pulmonary/Chest: Effort normal and breath sounds normal. He has no wheezes. He has no rales.  Neurological: He is alert and oriented to person, place, and time.  Psychiatric: He has a normal mood and affect.    Vitals reviewed.  Lab Results  Component Value Date   WBC 6.5 11/29/2015   HGB 16.2 11/29/2015   HCT 47.1 11/29/2015   PLT 253.0 11/29/2015   GLUCOSE 90 12/20/2015   CHOL 216 (H) 11/29/2015   TRIG 303.0 (H) 11/29/2015   HDL 30.40 (L) 11/29/2015   LDLDIRECT 108.0 11/29/2015   ALT 22 11/29/2015   AST 17 11/29/2015   NA 137 12/20/2015   K 4.4 12/20/2015   CL 101 12/20/2015   CREATININE 0.97 12/20/2015   BUN 16 12/20/2015   CO2 29 12/20/2015   TSH 2.69 11/29/2015   PSA 1.74 11/29/2015   HGBA1C 5.9 11/29/2015    Assessment & Plan:   Problem List Items Addressed This Visit    Viral respiratory infection    New acute problem. Viral in origin. Exam unremarkable. Treating with Tussionex.        Meds ordered this encounter  Medications  . chlorpheniramine-HYDROcodone (TUSSIONEX PENNKINETIC ER) 10-8 MG/5ML SUER    Sig: Take 5 mLs by mouth every 12 (twelve) hours as needed.    Dispense:  115 mL    Refill:  0    Follow-up: PRN  [place]

## 2016-02-03 NOTE — Patient Instructions (Signed)
Cough medication as needed.  This is viral and will slowly improve.  Take care  Dr. Lacinda Axon

## 2016-02-03 NOTE — Assessment & Plan Note (Signed)
New acute problem. Viral in origin. Exam unremarkable. Treating with Tussionex.

## 2016-06-05 ENCOUNTER — Other Ambulatory Visit: Payer: Self-pay | Admitting: Family Medicine

## 2016-07-10 ENCOUNTER — Ambulatory Visit: Payer: Self-pay

## 2016-07-10 ENCOUNTER — Other Ambulatory Visit: Payer: Self-pay | Admitting: Occupational Medicine

## 2016-07-10 DIAGNOSIS — M25531 Pain in right wrist: Secondary | ICD-10-CM

## 2016-10-15 ENCOUNTER — Other Ambulatory Visit: Payer: Self-pay | Admitting: *Deleted

## 2016-10-15 MED ORDER — DULOXETINE HCL 30 MG PO CPEP: 30.0000 mg | ORAL_CAPSULE | Freq: Two times a day (BID) | ORAL | 1 refills | Status: AC

## 2016-10-15 NOTE — Telephone Encounter (Signed)
Last office visit 12/29/15 chronic to follow up 1 month  02/03/16 acute

## 2016-10-15 NOTE — Telephone Encounter (Signed)
Pt's wife is requesting a medication refill for [employer]  [phone] Fax [phone]

## 2018-06-29 IMAGING — DX DG KNEE STANDING AP BILAT
1 series · 1 of 1 positions shown · non-contrast
Comparison: No recent prior.

CLINICAL DATA: Chronic knee pain.

EXAM:
BILATERAL KNEES STANDING - 1 VIEW

[knee ap]
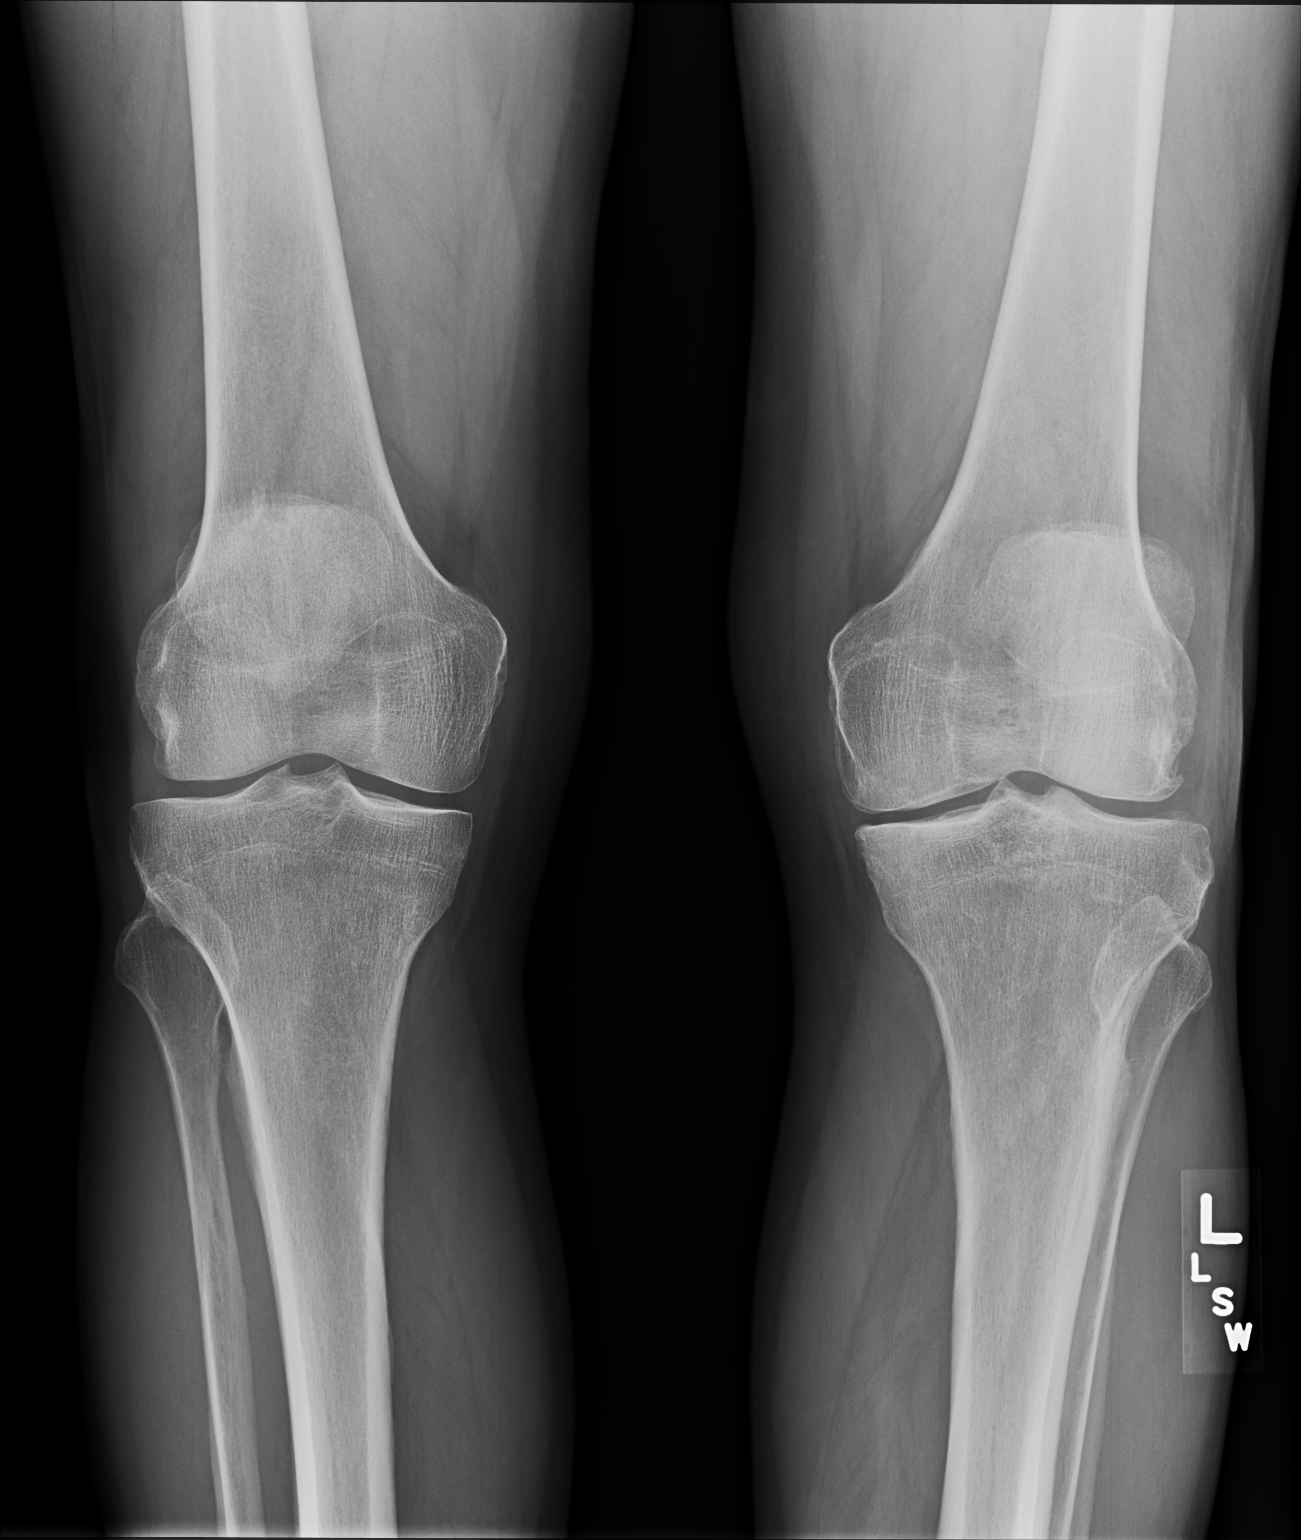

[1 of 1 positions shown; findings below may reference images not displayed]

FINDINGS: Mild degenerative changes both knees. No acute bony or joint
abnormality. No evidence of fracture or dislocation.
IMPRESSION: No acute or focal abnormality.
